# Patient Record
Sex: Female | Born: 1970 | Race: White | Hispanic: No | Marital: Single | Smoking: Former smoker
Health system: Southern US, Community
[De-identification: ages and names within clinical notes are randomized; demographics above are authoritative.]

## PROBLEM LIST (undated history)

## (undated) DIAGNOSIS — J45909 Unspecified asthma, uncomplicated: Secondary | ICD-10-CM

## (undated) DIAGNOSIS — J309 Allergic rhinitis, unspecified: Secondary | ICD-10-CM

## (undated) DIAGNOSIS — F1910 Other psychoactive substance abuse, uncomplicated: Secondary | ICD-10-CM

## (undated) DIAGNOSIS — T7840XA Allergy, unspecified, initial encounter: Secondary | ICD-10-CM

## (undated) HISTORY — DX: Unspecified asthma, uncomplicated: J45.909

## (undated) HISTORY — DX: Other psychoactive substance abuse, uncomplicated: F19.10

## (undated) HISTORY — PX: AUGMENTATION MAMMAPLASTY: SUR837

## (undated) HISTORY — DX: Allergic rhinitis, unspecified: J30.9

## (undated) HISTORY — DX: Allergy, unspecified, initial encounter: T78.40XA

---

## 1997-12-08 ENCOUNTER — Ambulatory Visit (HOSPITAL_COMMUNITY): Admission: RE | Admit: 1997-12-08 | Discharge: 1997-12-08 | Payer: Self-pay | Admitting: Gastroenterology

## 1998-02-09 ENCOUNTER — Ambulatory Visit (HOSPITAL_COMMUNITY): Admission: RE | Admit: 1998-02-09 | Discharge: 1998-02-09 | Payer: Self-pay | Admitting: Gastroenterology

## 1998-04-02 ENCOUNTER — Other Ambulatory Visit: Admission: RE | Admit: 1998-04-02 | Discharge: 1998-04-02 | Payer: Self-pay | Admitting: Gynecology

## 1998-05-02 ENCOUNTER — Encounter: Admission: RE | Admit: 1998-05-02 | Discharge: 1998-07-31 | Payer: Self-pay | Admitting: Gynecology

## 1998-10-29 ENCOUNTER — Inpatient Hospital Stay (HOSPITAL_COMMUNITY): Admission: AD | Admit: 1998-10-29 | Discharge: 1998-11-01 | Payer: Self-pay | Admitting: Gynecology

## 1998-12-13 ENCOUNTER — Other Ambulatory Visit: Admission: RE | Admit: 1998-12-13 | Discharge: 1998-12-13 | Payer: Self-pay | Admitting: Gynecology

## 2000-10-06 ENCOUNTER — Other Ambulatory Visit: Admission: RE | Admit: 2000-10-06 | Discharge: 2000-10-06 | Payer: Self-pay | Admitting: Gynecology

## 2001-05-06 ENCOUNTER — Inpatient Hospital Stay (HOSPITAL_COMMUNITY): Admission: AD | Admit: 2001-05-06 | Discharge: 2001-05-08 | Payer: Self-pay | Admitting: Gynecology

## 2001-06-18 ENCOUNTER — Other Ambulatory Visit: Admission: RE | Admit: 2001-06-18 | Discharge: 2001-06-18 | Payer: Self-pay | Admitting: Gynecology

## 2003-02-24 ENCOUNTER — Other Ambulatory Visit: Admission: RE | Admit: 2003-02-24 | Discharge: 2003-02-24 | Payer: Self-pay | Admitting: Gynecology

## 2004-03-15 ENCOUNTER — Other Ambulatory Visit: Admission: RE | Admit: 2004-03-15 | Discharge: 2004-03-15 | Payer: Self-pay | Admitting: Gynecology

## 2004-04-12 ENCOUNTER — Ambulatory Visit: Payer: Self-pay | Admitting: Internal Medicine

## 2004-05-10 ENCOUNTER — Ambulatory Visit: Payer: Self-pay | Admitting: Internal Medicine

## 2004-07-08 ENCOUNTER — Ambulatory Visit: Payer: Self-pay | Admitting: Internal Medicine

## 2005-03-17 ENCOUNTER — Ambulatory Visit: Payer: Self-pay | Admitting: Internal Medicine

## 2005-03-21 ENCOUNTER — Other Ambulatory Visit: Admission: RE | Admit: 2005-03-21 | Discharge: 2005-03-21 | Payer: Self-pay | Admitting: Gynecology

## 2005-05-30 ENCOUNTER — Ambulatory Visit: Payer: Self-pay | Admitting: Internal Medicine

## 2005-09-12 ENCOUNTER — Ambulatory Visit (HOSPITAL_COMMUNITY): Admission: RE | Admit: 2005-09-12 | Discharge: 2005-09-12 | Payer: Self-pay | Admitting: Gynecology

## 2006-04-07 ENCOUNTER — Other Ambulatory Visit: Admission: RE | Admit: 2006-04-07 | Discharge: 2006-04-07 | Payer: Self-pay | Admitting: Gynecology

## 2006-06-09 HISTORY — PX: UMBILICAL HERNIA REPAIR: SHX196

## 2006-11-20 ENCOUNTER — Ambulatory Visit: Payer: Self-pay | Admitting: Internal Medicine

## 2007-03-04 DIAGNOSIS — J45909 Unspecified asthma, uncomplicated: Secondary | ICD-10-CM | POA: Insufficient documentation

## 2007-03-04 DIAGNOSIS — F509 Eating disorder, unspecified: Secondary | ICD-10-CM | POA: Insufficient documentation

## 2007-03-04 DIAGNOSIS — J309 Allergic rhinitis, unspecified: Secondary | ICD-10-CM | POA: Insufficient documentation

## 2007-03-25 ENCOUNTER — Encounter: Payer: Self-pay | Admitting: Internal Medicine

## 2007-04-23 ENCOUNTER — Encounter: Payer: Self-pay | Admitting: Internal Medicine

## 2007-11-17 ENCOUNTER — Encounter: Payer: Self-pay | Admitting: Internal Medicine

## 2008-01-13 ENCOUNTER — Other Ambulatory Visit: Admission: RE | Admit: 2008-01-13 | Discharge: 2008-01-13 | Payer: Self-pay | Admitting: Family Medicine

## 2008-09-07 HISTORY — PX: VAGINAL WOUND CLOSURE / REPAIR: SUR258

## 2008-09-18 ENCOUNTER — Ambulatory Visit: Payer: Self-pay | Admitting: Gynecology

## 2008-09-18 ENCOUNTER — Ambulatory Visit (HOSPITAL_BASED_OUTPATIENT_CLINIC_OR_DEPARTMENT_OTHER): Admission: RE | Admit: 2008-09-18 | Discharge: 2008-09-18 | Payer: Self-pay | Admitting: Gynecology

## 2008-09-27 ENCOUNTER — Encounter: Payer: Self-pay | Admitting: Internal Medicine

## 2008-10-02 ENCOUNTER — Ambulatory Visit: Payer: Self-pay | Admitting: Gynecology

## 2008-11-13 ENCOUNTER — Encounter: Payer: Self-pay | Admitting: Internal Medicine

## 2009-05-15 ENCOUNTER — Other Ambulatory Visit: Admission: RE | Admit: 2009-05-15 | Discharge: 2009-05-15 | Payer: Self-pay | Admitting: Gynecology

## 2009-05-15 ENCOUNTER — Ambulatory Visit: Payer: Self-pay | Admitting: Gynecology

## 2010-07-09 ENCOUNTER — Ambulatory Visit (HOSPITAL_COMMUNITY)
Admission: RE | Admit: 2010-07-09 | Discharge: 2010-07-09 | Payer: Self-pay | Source: Home / Self Care | Attending: Gynecology | Admitting: Gynecology

## 2010-07-10 ENCOUNTER — Encounter: Payer: Self-pay | Admitting: Gynecology

## 2010-07-10 ENCOUNTER — Ambulatory Visit: Admit: 2010-07-10 | Payer: Self-pay | Admitting: Gynecology

## 2010-07-17 ENCOUNTER — Other Ambulatory Visit: Payer: Self-pay | Admitting: Gynecology

## 2010-07-17 ENCOUNTER — Encounter: Payer: 59 | Admitting: Gynecology

## 2010-07-17 ENCOUNTER — Other Ambulatory Visit (HOSPITAL_COMMUNITY)
Admission: RE | Admit: 2010-07-17 | Discharge: 2010-07-17 | Disposition: A | Discharge status: Home / Self Care | Payer: 59 | Source: Ambulatory Visit | Attending: Gynecology | Admitting: Gynecology

## 2010-07-17 DIAGNOSIS — Z124 Encounter for screening for malignant neoplasm of cervix: Secondary | ICD-10-CM | POA: Insufficient documentation

## 2010-07-17 DIAGNOSIS — Z13220 Encounter for screening for lipoid disorders: Secondary | ICD-10-CM

## 2010-07-17 DIAGNOSIS — Z01419 Encounter for gynecological examination (general) (routine) without abnormal findings: Secondary | ICD-10-CM

## 2010-07-17 DIAGNOSIS — Z30431 Encounter for routine checking of intrauterine contraceptive device: Secondary | ICD-10-CM

## 2010-09-18 LAB — POCT PREGNANCY, URINE: Preg Test, Ur: NEGATIVE

## 2010-10-22 NOTE — H&P (Signed)
NAMEVERNELL, Shelly Edwards            ACCOUNT NO.:  [id number]   MEDICAL RECORD NO.:  [id number]          PATIENT TYPE:  AMB   LOCATION:  [facility]                         FACILITY:  [facility]   PHYSICIAN:  Juan H. Lily Peer, M.D.DATE OF BIRTH:  February 14, 1971   DATE OF ADMISSION:  DATE OF DISCHARGE:                              HISTORY & PHYSICAL   The patient is scheduled for surgery today at 1 p.m. at [facility].   CHIEF COMPLAINT:  Vaginal bleeding.   HISTORY:  The patient is a 40 year old, gravida 2, para 2, who presented  to the office this morning complaining of vaginal bleeding after  intercourse Saturday night.  On inspection, she was found to have a  right vaginal wall laceration that was bleeding.  A pressure gauze was  placed for tamponade effect.  She did have also an ultrasound due the  fact that she had a Mirena IUD placed Shelly Edwards in 2007 and the IUD string  was not seen.  The ultrasound confirmed that the IUD was in its right  place and normal-appearing ovaries.  The patient otherwise had been  doing well.   PAST MEDICAL HISTORY:  The patient denies any allergies.   MEDICATIONS:  1. Singulair p.r.n.  2. Loratadine,  3. Multivitamin.  4. Albuterol p.r.n.  5. Mirena IUD placed in 2007.  6. Allergy shots.   MEDICAL CONDITIONS:  History of asthma and allergies.  No prior  surgeries.   FAMILY HISTORY:  Father and mother with hypertension, and paternal  grandmother with colon cancer.  Father with heart disease and breast  cancer in maternal grandmother.   PHYSICAL EXAMINATION:  The patient is 5 feet 2 inches tall, weight 126  pounds, blood pressure 120/18.  HEENT:  Unremarkable.  NECK:  Supple.  Trachea midline.  No carotid bruits, no thyromegaly.  LUNGS: Clear to auscultation without rhonchi or wheezes.  HEART:  Regular rate and rhythm.  No murmurs or gallops.  BREAST EXAM:  Not done.  ABDOMEN:  Soft, nontender rebound or guarding.  PELVIC:  Bartholin,  urethral, and Skene glands within normal limits.  Vagina with a right vaginal wall laceration.  Cervix - no gross lesions  on inspection.  Bimanual exam was not done.  An ultrasound demonstrated  a normal-appearing uterus and normal-appearing ovaries, and IUD in  place.  RECTAL EXAM:  Deferred.   ASSESSMENT:  A 40 year old, gravida 2, para 2, with postcoital bleeding  after which demonstrated that the source was from a right vaginal wall  laceration.  She is going to be taken to the Operating Room today for  repair of right vaginal wall laceration.  We will be checking a CBC and  a UPT in our office.  She will receive cefotetan 1 g prophylactically  before the procedure and I have given her a prescription of Tylox to  take 1 p.o. q.4-6 h. p.r.n. and Reglan 10 mg 1 p.o. q.4-6 h. p.r.n.  nausea, for her to take postop if needed, and she will see me Shelly Edwards in  the office 2 weeks after the procedure for a postop appointment.  PLAN:  As per assessment above.      Juan H. Lily Peer, M.D.  Electronically Signed     JHF/MEDQ  D:  09/18/2008  T:  09/18/2008  Job:  [id number]

## 2010-10-22 NOTE — Op Note (Signed)
Shelly Edwards, Shelly Edwards            ACCOUNT NO.:  [id number]   MEDICAL RECORD NO.:  [id number]          PATIENT TYPE:  AMB   LOCATION:  [facility]                         FACILITY:  [facility]   PHYSICIAN:  Juan H. Lily Peer, M.D.DATE OF BIRTH:  18-Apr-1971   DATE OF PROCEDURE:  09/18/2008  DATE OF DISCHARGE:                               OPERATIVE REPORT   INDICATIONS FOR OPERATION:  A 40 year old gravida 2, para 2 with right  vaginal wall laceration after intercourse this weekend.   PREOPERATIVE DIAGNOSIS:  Right vaginal wall laceration.   POSTOPERATIVE DIAGNOSES:  Right vaginal wall laceration.   ANESTHESIA:  General endotracheal anesthesia.   PROCEDURE PERFORMED:  Repair of right vaginal wall laceration.   FINDINGS:  Right vaginal wall laceration measuring approximately 3 cm in  length and 2 cm in width.   DESCRIPTION OF THE OPERATION:  After the patient was adequately  counseled, she was taken to the operating room where she underwent  successful general endotracheal anesthesia.  She received a gram of  Cefoxitin IV prophylactically.  She was placed on high lithotomy  position.  The vagina was prepped and draped in the usual sterile  fashion.  A Foley catheter was inserted to evacuate the bladder of its  contents.  With 2 Deaver retractors for exposure, the right vaginal  laceration was identified.  The area was copiously irrigated with normal  saline solution as well as Betadine solution.  The laceration was  repaired with locking stitch of 0 Vicryl suture.  The area was copiously  irrigated with normal saline solution as well again postop and then  estrogen cream was applied.  The patient extubated and transferred to  recovery room with stable vital signs.  Blood loss from procedure was  minimal.  Fluid resuscitation consisted of 500 cc of lactated Ringer's.      Juan H. Lily Peer, M.D.  Electronically Signed     JHF/MEDQ  D:  09/18/2008  T:  09/19/2008  Job:  [id number]

## 2010-10-25 NOTE — H&P (Signed)
[facility]  Patient:    Shelly Edwards, Shelly Edwards Visit Number: [id number] MRN: [id number]          Service Type: Attending:  Gaetano Hawthorne. Lily Peer, M.D. Dictated by:   Gaetano Hawthorne Lily Peer, M.D. Adm. Date:  05/06/01                           History and Physical  CHIEF COMPLAINT:              Spontaneous rupture of membranes.  HISTORY OF PRESENT ILLNESS:   The patient is a 40 year old gravida 2, para 1. Last menstrual period August 01, 2000.  Corrected estimate date of confinement May 09, 2001.  Currently 39-1/2 weeks estimated gestational age.  Had complaint of rupture of membranes approximately between 12:30 and 1:30 a.m. on May 06, 2001.  Upon arrival to [facility], she was checked and was found to be approximately 2-3 cm dilated, gross rupture of membranes with positive nitrazine and positive ferning, and was having contractions every five minutes apart.  The patients fetal heart rate tracing had a low baseline of 120 to 120 with some mild variables that later corrected after IV fluids and had reassuring fetal heart tracing.  PRENATAL HISTORY:             The patient with one normal spontaneous vaginal delivery in the year May 2000 of a 7 pounds and 13 ounce female.  The patients husband with a history of positive CMV infection several years ago.  The patient was tested during this pregnancy and was IgG positive, but IgM negative on both acute and convalescent titers.  The patient with a history of asthma, seasonal, and was taking Pulmicort and Claritin p.r.n.  PAST MEDICAL HISTORY:         As per above.  ALLERGIES:                    None.  MEDICATIONS:                  Pulmicort and Claritin and history of asthma, and one prior normal spontaneous vaginal delivery.  REVIEW OF SYSTEMS:            See __________ forms.  PHYSICAL EXAMINATION:  VITAL SIGNS:                  Stable and afebrile.  HEENT:                         Unremarkable.  NECK:                         Supple, trachea midline.  No carotid bruits.  No thyromegaly.  LUNGS:                        Clear to auscultation without rhonchi or wheezes.  HEART:                        Regular rate and rhythm without any murmurs or gallops.  BREASTS:                      Exam not done.  ABDOMEN:                      Gravid uterus, vertex presentation, positive  Leopold maneuver, positive fetal heart tone.  CERVIX:                       Three centimeters dilated, 80% effaced, -3 station.  Clear amniotic fluid.  Gross rupture of membranes.  EXTREMITIES:                  DTRs 1+.  Negative clonus.  Trace edema.  PRENATAL LABORATORY DATA:     Patient with O-positive blood type, negative antibody screen.  VDRL was nonreactive.  Rubella immune.  Hepatitis B surface antigen and HIV were negative.  Pap smear was normal.  Fetal protein was normal.  Diabetes screen was normal.  GBS culture was negative.  ASSESSMENT:                   A 40 year old gravida 2, para 1, at 39-1/2 weeks                               estimated gestational age with spontaneous                               rupture of membranes at approximately 12:30 a.m.                               on May 06, 2001.  Contractions every five                               to six minutes apart.  Cervix 3 cm dilated, 80%                               effaced, -3 station with gross rupture of                               membranes.  Reassuring fetal heart rate tracing.  PLAN:                         Patient to be admitted to labor and delivery once the epidural is on board, and will begin to augment with Pitocin at a high dose per protocol and the GBS culture was negative, and anticipate normal spontaneous vaginal delivery.  Plan is per assessment above. Dictated by:   Gaetano Hawthorne Lily Peer, M.D. Attending:  Gaetano Hawthorne. Lily Peer, M.D. DD:  05/06/01 TD:  05/06/01 Job: [id number]

## 2010-10-25 NOTE — Discharge Summary (Signed)
[facility]  Patient:    Shelly Edwards, Shelly Edwards Visit Number: [id number] MRN: [id number]          Service Type: OBS Location: 910A [id number] [id number] Attending Physician:  Tonye Royalty Dictated by:   Antony Contras, N.P. Admit Date:  05/06/2001 Discharge Date: 05/08/2001                             Discharge Summary  DISCHARGE DIAGNOSES:          Intrauterine pregnancy at term, spontaneous rupture of membranes.  PROCEDURE:                    Normal spontaneous vaginal delivery of viable infant over a intact perineum with repair of second degree laceration.  HISTORY OF PRESENT ILLNESS:   Patient is a 40 year old gravida 2, para 1-0-0-1 with LMP ______ [facility] May 09, 2001.  Prenatal course was complicated by history of asthma.  PRENATAL LABORATORIES:        Blood type O+.  Antibody screen negative.  RPR, HBSAG, HIV nonreactive.  MSAFP normal.  GBS negative.  HOSPITAL COURSE:              Patient was admitted on May 06, 2001 with spontaneous rupture of membranes.  Cervix was 2-3 cm dilated.  She was admitted to begin augmentation with Pitocin high dose protocol.  She progressed to completely dilatation.  Delivered an Apgar 8/8 female infant, birth weight 6 pounds 14 ounces over an intact perineum with repair of second degree laceration.  Postpartum course she remained afebrile, had no difficulty voiding.  Was able to be discharged in satisfactory condition on her second postpartum day.  LABORATORIES:                 CBC:  Hematocrit 33.1, hemoglobin 11.6, platelets 178,000.  DISPOSITION:                  Follow up in six weeks.  Continue prenatal vitamins and iron.  Motrin and Tylox for pain. Dictated by:   Antony Contras, N.P. Attending Physician:  Tonye Royalty DD:  06/11/01 TD:  06/12/01 Job: [id number]

## 2012-05-12 ENCOUNTER — Other Ambulatory Visit: Payer: Self-pay | Admitting: Gynecology

## 2012-05-12 DIAGNOSIS — Z1231 Encounter for screening mammogram for malignant neoplasm of breast: Secondary | ICD-10-CM

## 2012-05-27 ENCOUNTER — Encounter: Payer: Self-pay | Admitting: Gynecology

## 2012-05-27 ENCOUNTER — Ambulatory Visit (INDEPENDENT_AMBULATORY_CARE_PROVIDER_SITE_OTHER): Payer: 59 | Admitting: Gynecology

## 2012-05-27 VITALS — BP 120/68 | Ht 61.0 in | Wt 124.0 lb

## 2012-05-27 DIAGNOSIS — Z01419 Encounter for gynecological examination (general) (routine) without abnormal findings: Secondary | ICD-10-CM

## 2012-05-27 NOTE — Progress Notes (Signed)
Lakea Edwards 10/07/1970 [id number]   History:    41 y.o.  for annual gyn exam with no complaints. She is having normal menstrual cycles. Her mammogram was in January 2012 was normal. Patient does her monthly self breast examination. No prior history of abnormal Pap smear. Patient's flu vaccine in Tdap are up-to-date. Patient with saline breast implants. Husband has had a vasectomy.  Past medical history,surgical history, family history and social history were all reviewed and documented in the EPIC chart.  Gynecologic History Patient's last menstrual period was 05/04/2012. Contraception: vasectomy Last Pap: 2012. Results were: normal Last mammogram: 2012 Results were: normal  Obstetric History OB History    Grav Para Term Preterm Abortions TAB SAB Ect Mult Living   2 2 2       2      # Outc Date GA Lbr Len/2nd Wgt Sex Del Anes PTL Lv   1 TRM     M SVD  No Yes   2 TRM     M SVD  No Yes       ROS: A ROS was performed and pertinent positives and negatives are included in the history.  GENERAL: No fevers or chills. HEENT: No change in vision, no earache, sore throat or sinus congestion. NECK: No pain or stiffness. CARDIOVASCULAR: No chest pain or pressure. No palpitations. PULMONARY: No shortness of breath, cough or wheeze. GASTROINTESTINAL: No abdominal pain, nausea, vomiting or diarrhea, melena or bright red blood per rectum. GENITOURINARY: No urinary frequency, urgency, hesitancy or dysuria. MUSCULOSKELETAL: No joint or muscle pain, no back pain, no recent trauma. DERMATOLOGIC: No rash, no itching, no lesions. ENDOCRINE: No polyuria, polydipsia, no heat or cold intolerance. No recent change in weight. HEMATOLOGICAL: No anemia or easy bruising or bleeding. NEUROLOGIC: No headache, seizures, numbness, tingling or weakness. PSYCHIATRIC: No depression, no loss of interest in normal activity or change in sleep pattern.     Exam: chaperone present  BP 120/68  Ht 5\' 1"  (1.549 m)   Wt 124 lb (56.246 kg)  BMI 23.43 kg/m2  LMP 05/04/2012  Body mass index is 23.43 kg/(m^2).  General appearance : Well developed well nourished female. No acute distress HEENT: Neck supple, trachea midline, no carotid bruits, no thyroidmegaly Lungs: Clear to auscultation, no rhonchi or wheezes, or rib retractions  Heart: Regular rate and rhythm, no murmurs or gallops Breast:Examined in sitting and supine position were symmetrical in appearance, no palpable masses or tenderness,  no skin retraction, no nipple inversion, no nipple discharge, no skin discoloration, no axillary or supraclavicular lymphadenopathy Abdomen: no palpable masses or tenderness, no rebound or guarding Extremities: no edema or skin discoloration or tenderness  Pelvic:  Bartholin, Urethra, Skene Glands: Within normal limits             Vagina: No gross lesions or discharge  Cervix: No gross lesions or discharge  Uterus  anteverted, normal size, shape and consistency, non-tender and mobile  Adnexa  Without masses or tenderness  Anus and perineum  normal   Rectovaginal  normal sphincter tone without palpated masses or tenderness             Hemoccult not done     Assessment/Plan:  41 y.o. female for annual exam who was instructed to begin taking calcium and vitamin D for osteoporosis prevention. Her primary physician recently did her lab work and she is going to obtain a copy so that we can scan and put in the epic records. No Pap smear  done today to guidelines discussed.    Ok Edwards MD, 3:03 PM 05/27/2012

## 2012-05-27 NOTE — Patient Instructions (Addendum)
Recommended daily requirement of calcium is 1200-1500 mg daily and 1,000-2,000 units of vitamin D3 cholecalciferol daily.

## 2012-06-04 ENCOUNTER — Encounter: Payer: Self-pay | Admitting: Gynecology

## 2012-06-10 ENCOUNTER — Ambulatory Visit (HOSPITAL_COMMUNITY)
Admission: RE | Admit: 2012-06-10 | Discharge: 2012-06-10 | Disposition: A | Discharge status: Home / Self Care | Payer: 59 | Source: Ambulatory Visit | Attending: Gynecology | Admitting: Gynecology

## 2012-06-10 ENCOUNTER — Other Ambulatory Visit: Payer: Self-pay | Admitting: Gynecology

## 2012-06-10 DIAGNOSIS — Z1231 Encounter for screening mammogram for malignant neoplasm of breast: Secondary | ICD-10-CM | POA: Insufficient documentation

## 2012-06-17 ENCOUNTER — Other Ambulatory Visit: Payer: Self-pay | Admitting: Gynecology

## 2012-06-17 ENCOUNTER — Other Ambulatory Visit: Payer: Self-pay | Admitting: *Deleted

## 2012-06-17 DIAGNOSIS — N6310 Unspecified lump in the right breast, unspecified quadrant: Secondary | ICD-10-CM

## 2012-06-17 DIAGNOSIS — R928 Other abnormal and inconclusive findings on diagnostic imaging of breast: Secondary | ICD-10-CM

## 2012-07-07 ENCOUNTER — Ambulatory Visit
Admission: RE | Admit: 2012-07-07 | Discharge: 2012-07-07 | Disposition: A | Discharge status: Home / Self Care | Payer: 59 | Source: Ambulatory Visit | Attending: Gynecology | Admitting: Gynecology

## 2012-07-07 DIAGNOSIS — N6310 Unspecified lump in the right breast, unspecified quadrant: Secondary | ICD-10-CM

## 2012-07-07 DIAGNOSIS — R928 Other abnormal and inconclusive findings on diagnostic imaging of breast: Secondary | ICD-10-CM

## 2013-05-30 ENCOUNTER — Ambulatory Visit (INDEPENDENT_AMBULATORY_CARE_PROVIDER_SITE_OTHER): Payer: 59 | Admitting: Gynecology

## 2013-05-30 ENCOUNTER — Encounter: Payer: Self-pay | Admitting: Gynecology

## 2013-05-30 VITALS — BP 118/72 | Ht 63.5 in | Wt 124.2 lb

## 2013-05-30 DIAGNOSIS — Z01419 Encounter for gynecological examination (general) (routine) without abnormal findings: Secondary | ICD-10-CM

## 2013-05-30 NOTE — Progress Notes (Signed)
Shelly Edwards 12/29/1970 [id number]   History:    42 y.o.  for annual gyn exam with no complaints today. Patient's husband has had a vasectomy. She is having normal menstrual cycles. Her mammogram was normal this year but her breasts reduced. She does have implants.  Patient has informed me of the following family history: Both mother and father had history of benign colon polyps Grandmother on father's side had colon cancer history Grandmother on mother's side had history of breast cancer  Patient with no prior history of abnormal Pap smears  Past medical history,surgical history, family history and social history were all reviewed and documented in the EPIC chart.  Gynecologic History Patient's last menstrual period was 05/25/2013. Contraception: vasectomy Last Pap: 2012. Results were: normal Last mammogram: 2014. Results were: normal but dense  Obstetric History OB History  Gravida Para Term Preterm AB SAB TAB Ectopic Multiple Living  2 2 2       2     # Outcome Date GA Lbr Len/2nd Weight Sex Delivery Anes PTL Lv  2 TRM     M SVD  N Y  1 TRM     M SVD  N Y       ROS: A ROS was performed and pertinent positives and negatives are included in the history.  GENERAL: No fevers or chills. HEENT: No change in vision, no earache, sore throat or sinus congestion. NECK: No pain or stiffness. CARDIOVASCULAR: No chest pain or pressure. No palpitations. PULMONARY: No shortness of breath, cough or wheeze. GASTROINTESTINAL: No abdominal pain, nausea, vomiting or diarrhea, melena or bright red blood per rectum. GENITOURINARY: No urinary frequency, urgency, hesitancy or dysuria. MUSCULOSKELETAL: No joint or muscle pain, no back pain, no recent trauma. DERMATOLOGIC: No rash, no itching, no lesions. ENDOCRINE: No polyuria, polydipsia, no heat or cold intolerance. No recent change in weight. HEMATOLOGICAL: No anemia or easy bruising or bleeding. NEUROLOGIC: No headache, seizures,  numbness, tingling or weakness. PSYCHIATRIC: No depression, no loss of interest in normal activity or change in sleep pattern.     Exam: chaperone present  BP 118/72  Ht 5' 3.5" (1.613 m)  Wt 124 lb 3.2 oz (56.337 kg)  BMI 21.65 kg/m2  LMP 05/25/2013  Body mass index is 21.65 kg/(m^2).  General appearance : Well developed well nourished female. No acute distress HEENT: Neck supple, trachea midline, no carotid bruits, no thyroidmegaly Lungs: Clear to auscultation, no rhonchi or wheezes, or rib retractions  Heart: Regular rate and rhythm, no murmurs or gallops Breast:Examined in sitting and supine position were symmetrical in appearance, no palpable masses or tenderness,  no skin retraction, no nipple inversion, no nipple discharge, no skin discoloration, no axillary or supraclavicular lymphadenopathy Abdomen: no palpable masses or tenderness, no rebound or guarding Extremities: no edema or skin discoloration or tenderness  Pelvic:  Bartholin, Urethra, Skene Glands: Within normal limits             Vagina: No gross lesions or discharge  Cervix: No gross lesions or discharge  Uterus  anteverted, normal size, shape and consistency, non-tender and mobile  Adnexa  Without masses or tenderness  Anus and perineum  normal   Rectovaginal  normal sphincter tone without palpated masses or tenderness             Hemoccult none indicated     Assessment/Plan:  42 y.o. female for annual exam who had her blood drawn today by her PCP. Patient states all her vaccines  are up-to-date. Pap smear not done today the new guidelines were discussed. We discussed the importance of calcium vitamin D and regular exercise for osteoporosis prevention. It will be recommended that patient next year have a screening colonoscopy because of family history of colon polyps and colon cancer. Literature information on Lynch Syndrome testing was provided.  Note: This dictation was prepared with  Dragon/digital dictation  along withSmart phrase technology. Any transcriptional errors that result from this process are unintentional.   Ok Edwards MD, 2:41 PM 05/30/2013

## 2013-05-30 NOTE — Patient Instructions (Signed)
Colonoscopy A colonoscopy is an exam to evaluate your entire colon. In this exam, your colon is cleansed. A long fiberoptic tube is inserted through your rectum and into your colon. The fiberoptic scope (endoscope) is a long bundle of enclosed and very flexible fibers. These fibers transmit light to the area examined and send images from that area to your caregiver. Discomfort is usually minimal. You may be given a drug to help you sleep (sedative) during or prior to the procedure. This exam helps to detect lumps (tumors), polyps, inflammation, and areas of bleeding. Your caregiver may also take a small piece of tissue (biopsy) that will be examined under a microscope. LET YOUR CAREGIVER KNOW ABOUT:   Allergies to food or medicine.  Medicines taken, including vitamins, herbs, eyedrops, over-the-counter medicines, and creams.  Use of steroids (by mouth or creams).  Previous problems with anesthetics or numbing medicines.  History of bleeding problems or blood clots.  Previous surgery.  Other health problems, including diabetes and kidney problems.  Possibility of pregnancy, if this applies. BEFORE THE PROCEDURE   A clear liquid diet may be required for 2 days before the exam.  Ask your caregiver about changing or stopping your regular medications.  Liquid injections (enemas) or laxatives may be required.  A large amount of electrolyte solution may be given to you to drink over a short period of time. This solution is used to clean out your colon.  You should be present 60 minutes prior to your procedure or as directed by your caregiver. AFTER THE PROCEDURE   If you received a sedative or pain relieving medication, you will need to arrange for someone to drive you home.  Occasionally, there is a little blood passed with the first bowel movement. Do not be concerned. FINDING OUT THE RESULTS OF YOUR TEST Not all test results are available during your visit. If your test results are  not back during the visit, make an appointment with your caregiver to find out the results. Do not assume everything is normal if you have not heard from your caregiver or the medical facility. It is important for you to follow up on all of your test results. HOME CARE INSTRUCTIONS   It is not unusual to pass moderate amounts of gas and experience mild abdominal cramping following the procedure. This is due to air being used to inflate your colon during the exam. Walking or a warm pack on your belly (abdomen) may help.  You may resume all normal meals and activities after sedatives and medicines have worn off.  Only take over-the-counter or prescription medicines for pain, discomfort, or fever as directed by your caregiver. Do not use aspirin or blood thinners if a biopsy was taken. Consult your caregiver for medicine usage if biopsies were taken. SEEK IMMEDIATE MEDICAL CARE IF:   You have a fever.  You pass large blood clots or fill a toilet with blood following the procedure. This may also occur 10 to 14 days following the procedure. This is more likely if a biopsy was taken.  You develop abdominal pain that keeps getting worse and cannot be relieved with medicine. Document Released: 05/23/2000 Document Revised: 08/18/2011 Document Reviewed: 12/27/2012 [employer] Patient Information 2014 [employer].  

## 2014-04-10 ENCOUNTER — Encounter: Payer: Self-pay | Admitting: Gynecology

## 2014-05-19 ENCOUNTER — Other Ambulatory Visit: Payer: Self-pay

## 2014-05-19 DIAGNOSIS — Z1231 Encounter for screening mammogram for malignant neoplasm of breast: Secondary | ICD-10-CM

## 2014-05-31 ENCOUNTER — Encounter: Payer: Self-pay | Admitting: Gynecology

## 2014-05-31 ENCOUNTER — Other Ambulatory Visit (HOSPITAL_COMMUNITY)
Admission: RE | Admit: 2014-05-31 | Discharge: 2014-05-31 | Disposition: A | Discharge status: Home / Self Care | Payer: 59 | Source: Ambulatory Visit | Attending: Gynecology | Admitting: Gynecology

## 2014-05-31 ENCOUNTER — Ambulatory Visit (INDEPENDENT_AMBULATORY_CARE_PROVIDER_SITE_OTHER): Payer: 59 | Admitting: Gynecology

## 2014-05-31 VITALS — BP 112/78 | Ht 61.0 in | Wt 127.0 lb

## 2014-05-31 DIAGNOSIS — Z01411 Encounter for gynecological examination (general) (routine) with abnormal findings: Secondary | ICD-10-CM | POA: Diagnosis present

## 2014-05-31 DIAGNOSIS — Z1151 Encounter for screening for human papillomavirus (HPV): Secondary | ICD-10-CM | POA: Insufficient documentation

## 2014-05-31 DIAGNOSIS — Z01419 Encounter for gynecological examination (general) (routine) without abnormal findings: Secondary | ICD-10-CM

## 2014-05-31 DIAGNOSIS — Z8371 Family history of colonic polyps: Secondary | ICD-10-CM

## 2014-05-31 NOTE — Progress Notes (Signed)
Shelly Edwards 1971/06/03 [id number]   History:    43 y.o.  for annual exam with no complaints today.Patient's husband has had a vasectomy. She is having normal menstrual cycles. Her mammogram was normal this year but her breasts reduced. She does have implants.  Patient has informed me of the following family history: Both mother and father had history of benign colon polyps Grandmother on father's side had colon cancer history Grandmother on mother's side had history of breast cancer  Patient with no prior history of abnormal Pap smears  Last year was recommended that she undergo a screening colonoscopy because of family history of colon polyps and colon cancer. Also literature information on Lynch Syndrome testing was provided.   Past medical history,surgical history, family history and social history were all reviewed and documented in the EPIC chart.  Gynecologic History Patient's last menstrual period was 05/23/2014. Contraception: vasectomy Last Pap: 2012. Results were: normal Last mammogram: 2014. Results were: Normal but patient with saline implants and dense breasts  Obstetric History OB History  Gravida Para Term Preterm AB SAB TAB Ectopic Multiple Living  2 2 2       2     # Outcome Date GA Lbr Len/2nd Weight Sex Delivery Anes PTL Lv  2 Term     M Vag-Spont  N Y  1 Term     M Vag-Spont  N Y       ROS: A ROS was performed and pertinent positives and negatives are included in the history.  GENERAL: No fevers or chills. HEENT: No change in vision, no earache, sore throat or sinus congestion. NECK: No pain or stiffness. CARDIOVASCULAR: No chest pain or pressure. No palpitations. PULMONARY: No shortness of breath, cough or wheeze. GASTROINTESTINAL: No abdominal pain, nausea, vomiting or diarrhea, melena or bright red blood per rectum. GENITOURINARY: No urinary frequency, urgency, hesitancy or dysuria. MUSCULOSKELETAL: No joint or muscle pain, no back pain, no  recent trauma. DERMATOLOGIC: No rash, no itching, no lesions. ENDOCRINE: No polyuria, polydipsia, no heat or cold intolerance. No recent change in weight. HEMATOLOGICAL: No anemia or easy bruising or bleeding. NEUROLOGIC: No headache, seizures, numbness, tingling or weakness. PSYCHIATRIC: No depression, no loss of interest in normal activity or change in sleep pattern.     Exam: chaperone present  BP 112/78 mmHg  Ht 5\' 1"  (1.549 m)  Wt 127 lb (57.607 kg)  BMI 24.01 kg/m2  LMP 05/23/2014  Body mass index is 24.01 kg/(m^2).  General appearance : Well developed well nourished female. No acute distress HEENT: Neck supple, trachea midline, no carotid bruits, no thyroidmegaly Lungs: Clear to auscultation, no rhonchi or wheezes, or rib retractions  Heart: Regular rate and rhythm, no murmurs or gallops Breast:Examined in sitting and supine position were symmetrical in appearance, no palpable masses or tenderness,  no skin retraction, no nipple inversion, no nipple discharge, no skin discoloration, no axillary or supraclavicular lymphadenopathy Abdomen: no palpable masses or tenderness, no rebound or guarding Extremities: no edema or skin discoloration or tenderness  Pelvic:  Bartholin, Urethra, Skene Glands: Within normal limits             Vagina: No gross lesions or discharge  Cervix: No gross lesions or discharge  Uterus  anteverted, normal size, shape and consistency, non-tender and mobile  Adnexa  Without masses or tenderness  Anus and perineum  normal   Rectovaginal  normal sphincter tone without palpated masses or tenderness  Hemoccult not indicated     Assessment/Plan:  43 y.o. female for annual exam will have her blood drawn by her PCP. Patient's vaccines are all up-to-date. We discussed importance of calcium vitamin D and regular exercise for osteoporosis prevention. Pap smear was done today. I've given her the name of the gastric neurologist in her community for her to  schedule a screening colonoscopy because of family history of colon polyps and colon cancer. I also provided her with literature information on Lynch syndrome for for consideration of testing which she will discuss with the gastroenterologists.   Terrance Mass MD, 2:39 PM 05/31/2014

## 2014-05-31 NOTE — Patient Instructions (Signed)
Colonoscopy  A colonoscopy is an exam to look at the entire large intestine (colon). This exam can help find problems such as tumors, polyps, inflammation, and areas of bleeding. The exam takes about 1 hour.   LET YOUR HEALTH CARE PROVIDER KNOW ABOUT:   · Any allergies you have.  · All medicines you are taking, including vitamins, herbs, eye drops, creams, and over-the-counter medicines.  · Previous problems you or members of your family have had with the use of anesthetics.  · Any blood disorders you have.  · Previous surgeries you have had.  · Medical conditions you have.  RISKS AND COMPLICATIONS   Generally, this is a safe procedure. However, as with any procedure, complications can occur. Possible complications include:  · Bleeding.  · Tearing or rupture of the colon wall.  · Reaction to medicines given during the exam.  · Infection (rare).  BEFORE THE PROCEDURE   · Ask your health care provider about changing or stopping your regular medicines.  · You may be prescribed an oral bowel prep. This involves drinking a large amount of medicated liquid, starting the day before your procedure. The liquid will cause you to have multiple loose stools until your stool is almost clear or light green. This cleans out your colon in preparation for the procedure.  · Do not eat or drink anything else once you have started the bowel prep, unless your health care provider tells you it is safe to do so.  · Arrange for someone to drive you home after the procedure.  PROCEDURE   · You will be given medicine to help you relax (sedative).  · You will lie on your side with your knees bent.  · A long, flexible tube with a light and camera on the end (colonoscope) will be inserted through the rectum and into the colon. The camera sends video back to a computer screen as it moves through the colon. The colonoscope also releases carbon dioxide gas to inflate the colon. This helps your health care provider see the area better.  · During  the exam, your health care provider may take a small tissue sample (biopsy) to be examined under a microscope if any abnormalities are found.  · The exam is finished when the entire colon has been viewed.  AFTER THE PROCEDURE   · Do not drive for 24 hours after the exam.  · You may have a small amount of blood in your stool.  · You may pass moderate amounts of gas and have mild abdominal cramping or bloating. This is caused by the gas used to inflate your colon during the exam.  · Ask when your test results will be ready and how you will get your results. Make sure you get your test results.  Document Released: 05/23/2000 Document Revised: 03/16/2013 Document Reviewed: 01/31/2013  ExitCare® Patient Information ©2015 [employer]. This information is not intended to replace advice given to you by your health care provider. Make sure you discuss any questions you have with your health care provider.

## 2014-06-06 LAB — CYTOLOGY - PAP

## 2014-06-09 HISTORY — PX: COLONOSCOPY: SHX174

## 2014-06-12 ENCOUNTER — Ambulatory Visit
Admission: RE | Admit: 2014-06-12 | Discharge: 2014-06-12 | Disposition: A | Discharge status: Home / Self Care | Payer: 59 | Source: Ambulatory Visit

## 2014-06-12 DIAGNOSIS — Z1231 Encounter for screening mammogram for malignant neoplasm of breast: Secondary | ICD-10-CM

## 2014-07-10 ENCOUNTER — Encounter: Payer: Self-pay | Admitting: Internal Medicine

## 2014-08-28 ENCOUNTER — Ambulatory Visit: Payer: 59 | Admitting: Gastroenterology

## 2014-09-12 ENCOUNTER — Encounter: Payer: Self-pay | Admitting: Internal Medicine

## 2014-09-12 ENCOUNTER — Ambulatory Visit (INDEPENDENT_AMBULATORY_CARE_PROVIDER_SITE_OTHER): Payer: Self-pay | Admitting: Internal Medicine

## 2014-09-12 VITALS — BP 124/80 | HR 68 | Ht 61.25 in | Wt 126.2 lb

## 2014-09-12 DIAGNOSIS — Z800 Family history of malignant neoplasm of digestive organs: Secondary | ICD-10-CM

## 2014-09-12 DIAGNOSIS — Z1211 Encounter for screening for malignant neoplasm of colon: Secondary | ICD-10-CM

## 2014-09-12 DIAGNOSIS — Z8371 Family history of colonic polyps: Secondary | ICD-10-CM

## 2014-09-12 NOTE — Progress Notes (Signed)
   Subjective:    Patient ID: Shelly Edwards, female    DOB: Nov 05, 1970, 44 y.o.   MRN: [id number] Cc: colon cancer screening - family hx HPI Pleasant 44 yo woman with family hx colon polyps and cancer. No active GI sxs. Dr. Caleen Jobs suggested she consider screening before 50.  Paternal grandmother had colon cancer at about 30. Mother had pre-cancerous polyps in 75's and father in early 64's  Medications, allergies, past medical history, past surgical history, family history and social history are reviewed and updated in the EMR.  Review of Systems Recent sesonal allergies and skin rask O/w negative    Objective:   Physical Exam BP 124/80 mmHg  Pulse 68  Ht 5' 1.25" (1.556 m)  Wt 126 lb 4 oz (57.267 kg)  BMI 23.65 kg/m2  LMP 09/10/2014 Lungs cta Cor s1s2 no rmg     Assessment & Plan:   1. Colon cancer screening   2. Family history of colon cancer   3. Family history of polyps in the colon    She has 2 first-degree relatives with a history of precancerous colon polyps, her mother in her 55s and her father in her 24s and her paternal grandmother had colon cancer at about 89. I think it is reasonable proceed with a colonoscopy now we will see what findings are they're determine the next time of colonoscopy, if no polyps probably around the time of 49. Further plans pending clinical course. I think she is at somewhat increased risk for colon cancer based upon this.  The risks and benefits as well as alternatives of endoscopic procedure(s) have been discussed and reviewed. All questions answered. The patient agrees to proceed.   I appreciate the opportunity to care for this patient. Cc: Uvaldo Rising, MD

## 2014-09-12 NOTE — Patient Instructions (Signed)
  You have been scheduled for a colonoscopy. Please follow written instructions given to you at your visit today.  Please pick up your prep supplies at the pharmacy within the next 1-3 days. If you use inhalers (even only as needed), please bring them with you on the day of your procedure. Your physician has requested that you go to www.startemmi.com and enter the access code given to you at your visit today. This web site gives a general overview about your procedure. However, you should still follow specific instructions given to you by our office regarding your preparation for the procedure.    I appreciate the opportunity to care for you.  

## 2014-09-20 ENCOUNTER — Encounter: Payer: Self-pay | Admitting: Internal Medicine

## 2014-09-26 ENCOUNTER — Encounter: Payer: Self-pay | Admitting: Internal Medicine

## 2014-09-26 ENCOUNTER — Ambulatory Visit (AMBULATORY_SURGERY_CENTER): Payer: 59 | Admitting: Internal Medicine

## 2014-09-26 VITALS — BP 113/76 | HR 60 | Temp 96.8°F | Resp 21 | Ht 61.0 in | Wt 126.0 lb

## 2014-09-26 DIAGNOSIS — Z8371 Family history of colonic polyps: Secondary | ICD-10-CM | POA: Diagnosis not present

## 2014-09-26 DIAGNOSIS — Z800 Family history of malignant neoplasm of digestive organs: Secondary | ICD-10-CM

## 2014-09-26 DIAGNOSIS — Z1211 Encounter for screening for malignant neoplasm of colon: Secondary | ICD-10-CM

## 2014-09-26 MED ORDER — SODIUM CHLORIDE 0.9 % IV SOLN: 500.0000 mL | INTRAVENOUS | Status: DC

## 2014-09-26 NOTE — Patient Instructions (Addendum)
Your colonoscopy was normal.  I am going to recommend you return in about 5 years - (04/2020 approximately).  I appreciate the opportunity to care for you. Gatha Mayer, MD, FACG  YOU HAD AN ENDOSCOPIC PROCEDURE TODAY AT [facility]:   Refer to the procedure report that was given to you for any specific questions about what was found during the examination.  If the procedure report does not answer your questions, please call your gastroenterologist to clarify.  If you requested that your care partner not be given the details of your procedure findings, then the procedure report has been included in a sealed envelope for you to review at your convenience later.  YOU SHOULD EXPECT: Some feelings of bloating in the abdomen. Passage of more gas than usual.  Walking can help get rid of the air that was put into your GI tract during the procedure and reduce the bloating. If you had a lower endoscopy (such as a colonoscopy or flexible sigmoidoscopy) you may notice spotting of blood in your stool or on the toilet paper. If you underwent a bowel prep for your procedure, you may not have a normal bowel movement for a few days.  Please Note:  You might notice some irritation and congestion in your nose or some drainage.  This is from the oxygen used during your procedure.  There is no need for concern and it should clear up in a day or so.  SYMPTOMS TO REPORT IMMEDIATELY:   Following lower endoscopy (colonoscopy or flexible sigmoidoscopy):  Excessive amounts of blood in the stool  Significant tenderness or worsening of abdominal pains  Swelling of the abdomen that is new, acute  Fever of 100F or higher    For urgent or emergent issues, a gastroenterologist can be reached at any hour by calling [phone].   DIET: Your first meal following the procedure should be a small meal and then it is ok to progress to your normal diet. Heavy or fried foods are harder to digest  and may make you feel nauseous or bloated.  Likewise, meals heavy in dairy and vegetables can increase bloating.  Drink plenty of fluids but you should avoid alcoholic beverages for 24 hours.  ACTIVITY:  You should plan to take it easy for the rest of today and you should NOT DRIVE or use heavy machinery until tomorrow (because of the sedation medicines used during the test).    FOLLOW UP: Our staff will call the number listed on your records the next business day following your procedure to check on you and address any questions or concerns that you may have regarding the information given to you following your procedure. If we do not reach you, we will leave a message.  However, if you are feeling well and you are not experiencing any problems, there is no need to return our call.  We will assume that you have returned to your regular daily activities without incident.  If any biopsies were taken you will be contacted by phone or by letter within the next 1-3 weeks.  Please call us at [phone] if you have not heard about the biopsies in 3 weeks.    SIGNATURES/CONFIDENTIALITY: You and/or your care partner have signed paperwork which will be entered into your electronic medical record.  These signatures attest to the fact that that the information above on your After Visit Summary has been reviewed and is understood.  Full responsibility of the  confidentiality of this discharge information lies with you and/or your care-partner.

## 2014-09-26 NOTE — Progress Notes (Signed)
Patient awakening,vss,report to rn 

## 2014-09-26 NOTE — Op Note (Addendum)
[facility]  [address] [place], [address]   COLONOSCOPY PROCEDURE REPORT  PATIENT: Shelly Edwards, Shelly Edwards  MR#: [id number] BIRTHDATE: March 27, 1971 , 43  yrs. old GENDER: female ENDOSCOPIST: Gatha Mayer, MD, [facility] PROCEDURE DATE:  09/26/2014 PROCEDURE:   Colonoscopy, screening First Screening Colonoscopy - Avg.  risk and is 50 yrs.  old or older Yes.  Prior Negative Screening - Now for repeat screening. N/A  History of Adenoma - Now for follow-up colonoscopy & has been > or = to 3 yrs.  N/A ASA CLASS:   Class II INDICATIONS:Screening for colonic neoplasia, FH Colon or Rectal Adenocarcinoma, and FH Colon Adenoma. MEDICATIONS: Propofol 300 mg IV and Monitored anesthesia care  DESCRIPTION OF PROCEDURE:   After the risks benefits and alternatives of the procedure were thoroughly explained, informed consent was obtained.  The digital rectal exam revealed no abnormalities of the rectum.   The [id number]  endoscope was introduced through the anus and advanced to the cecum, which was identified by both the appendix and ileocecal valve. No adverse events experienced.   The quality of the prep was (MiraLax was used) excellent.  The instrument was then slowly withdrawn as the colon was fully examined.      COLON FINDINGS: A normal appearing cecum, ileocecal valve, and appendiceal orifice were identified.  The ascending, transverse, descending, sigmoid colon, and rectum appeared unremarkable. Retroflexed views revealed no abnormalities. The time to cecum = 3.6 Withdrawal time = 10.1   The scope was withdrawn and the procedure completed. COMPLICATIONS: There were no immediate complications.  ENDOSCOPIC IMPRESSION: Normal colonoscopy - excellent prep  RECOMMENDATIONS: Repeat colonoscopy in 5 years. (04/2020 recall) in patient with PGM w/ CRCA, mother and father w/ polyps  eSigned:  Gatha Mayer, MD, [facility] 09/26/2014 10:18 AM Revised: 09/26/2014 10:18  AM  cc: The Patient and Dr. Uvaldo Rising

## 2014-09-27 ENCOUNTER — Telehealth: Payer: Self-pay

## 2014-09-27 NOTE — Telephone Encounter (Signed)
  Follow up Call-  Call back number 09/26/2014  Post procedure Call Back phone  # [phone]  Permission to leave phone message Yes     Patient questions:  Do you have a fever, pain , or abdominal swelling? No. Pain Score  0 *  Have you tolerated food without any problems? Yes.    Have you been able to return to your normal activities? Yes.    Do you have any questions about your discharge instructions: Diet   No. Medications  No. Follow up visit  No.  Do you have questions or concerns about your Care? No.  Actions: * If pain score is 4 or above: No action needed, pain <4.

## 2015-05-29 ENCOUNTER — Encounter: Payer: Self-pay | Admitting: Gynecology

## 2015-05-29 ENCOUNTER — Ambulatory Visit (INDEPENDENT_AMBULATORY_CARE_PROVIDER_SITE_OTHER): Payer: 59 | Admitting: Gynecology

## 2015-05-29 VITALS — BP 110/70 | Ht 61.5 in | Wt 122.0 lb

## 2015-05-29 DIAGNOSIS — Z01419 Encounter for gynecological examination (general) (routine) without abnormal findings: Secondary | ICD-10-CM | POA: Diagnosis not present

## 2015-05-29 NOTE — Progress Notes (Signed)
Shelly Edwards 1970-12-26 [id number]   History:    44 y.o.  for annual gyn exam with no complaints today. Patient reports normal menstrual cycles. Her husband has had a vasectomy. Patient has breast implants. Her GI history is as follows: Patient has informed me of the following family history: Both mother and father had history of benign colon polyps Grandmother on father's side had colon cancer history Grandmother on mother's side had history of breast cancer  Patient had a normal colonoscopy this year. Patient with no previous history of any abnormal Pap smears. Patient's flu vaccine was administered early this year. Her PCP Dr. [place] at [facility] has been doing her blood work.  Past medical history,surgical history, family history and social history were all reviewed and documented in the EPIC chart.  Gynecologic History Patient's last menstrual period was 05/06/2015. Contraception: vasectomy Last Pap: 2015. Results were: normal Last mammogram: 2016. Results were: Three-dimensional mammogram normal as a result of her breast implants and dense breasts  Obstetric History OB History  Gravida Para Term Preterm AB SAB TAB Ectopic Multiple Living  2 2 2       2     # Outcome Date GA Lbr Len/2nd Weight Sex Delivery Anes PTL Lv  2 Term     M Vag-Spont  N Y  1 Term     M Vag-Spont  N Y       ROS: A ROS was performed and pertinent positives and negatives are included in the history.  GENERAL: No fevers or chills. HEENT: No change in vision, no earache, sore throat or sinus congestion. NECK: No pain or stiffness. CARDIOVASCULAR: No chest pain or pressure. No palpitations. PULMONARY: No shortness of breath, cough or wheeze. GASTROINTESTINAL: No abdominal pain, nausea, vomiting or diarrhea, melena or bright red blood per rectum. GENITOURINARY: No urinary frequency, urgency, hesitancy or dysuria. MUSCULOSKELETAL: No joint or muscle pain, no back pain, no recent  trauma. DERMATOLOGIC: No rash, no itching, no lesions. ENDOCRINE: No polyuria, polydipsia, no heat or cold intolerance. No recent change in weight. HEMATOLOGICAL: No anemia or easy bruising or bleeding. NEUROLOGIC: No headache, seizures, numbness, tingling or weakness. PSYCHIATRIC: No depression, no loss of interest in normal activity or change in sleep pattern.     Exam: chaperone present  BP 110/70 mmHg  Ht 5' 1.5" (1.562 m)  Wt 122 lb (55.339 kg)  BMI 22.68 kg/m2  LMP 05/06/2015  Body mass index is 22.68 kg/(m^2).  General appearance : Well developed well nourished female. No acute distress HEENT: Eyes: no retinal hemorrhage or exudates,  Neck supple, trachea midline, no carotid bruits, no thyroidmegaly Lungs: Clear to auscultation, no rhonchi or wheezes, or rib retractions  Heart: Regular rate and rhythm, no murmurs or gallops Breast:Examined in sitting and supine position were symmetrical in appearance, no palpable masses or tenderness,  no skin retraction, no nipple inversion, no nipple discharge, no skin discoloration, no axillary or supraclavicular lymphadenopathy Abdomen: no palpable masses or tenderness, no rebound or guarding Extremities: no edema or skin discoloration or tenderness  Pelvic:  Bartholin, Urethra, Skene Glands: Within normal limits             Vagina: No gross lesions or discharge  Cervix: No gross lesions or discharge  Uterus  anteverted, normal size, shape and consistency, non-tender and mobile  Adnexa  Without masses or tenderness  Anus and perineum  normal   Rectovaginal  normal sphincter tone without palpated masses or tenderness  Hemoccult not indicated     Assessment/Plan:  44 y.o. female for annual exam with no complaints today. Normal examination today. We discussed importance of monthly breast exam. I have requested that when her primary care physician does her blood work to afford [place] a copy so that we can scan and make a part of her  electronic medical records. Pap smear not indicated this year. Flu vaccine up-to-date.   Terrance Mass MD, 2:16 PM 05/29/2015

## 2016-03-24 ENCOUNTER — Other Ambulatory Visit: Payer: Self-pay | Admitting: Gynecology

## 2016-03-24 DIAGNOSIS — Z9882 Breast implant status: Secondary | ICD-10-CM

## 2016-03-24 DIAGNOSIS — Z1231 Encounter for screening mammogram for malignant neoplasm of breast: Secondary | ICD-10-CM

## 2016-04-10 ENCOUNTER — Ambulatory Visit
Admission: RE | Admit: 2016-04-10 | Discharge: 2016-04-10 | Disposition: A | Discharge status: Home / Self Care | Payer: 59 | Source: Ambulatory Visit | Attending: Gynecology | Admitting: Gynecology

## 2016-04-10 DIAGNOSIS — Z9882 Breast implant status: Secondary | ICD-10-CM

## 2016-04-10 DIAGNOSIS — Z1231 Encounter for screening mammogram for malignant neoplasm of breast: Secondary | ICD-10-CM

## 2016-05-29 ENCOUNTER — Ambulatory Visit (INDEPENDENT_AMBULATORY_CARE_PROVIDER_SITE_OTHER): Payer: 59 | Admitting: Gynecology

## 2016-05-29 ENCOUNTER — Encounter: Payer: Self-pay | Admitting: Gynecology

## 2016-05-29 VITALS — BP 128/84 | Ht 61.5 in | Wt 117.0 lb

## 2016-05-29 DIAGNOSIS — Z01419 Encounter for gynecological examination (general) (routine) without abnormal findings: Secondary | ICD-10-CM | POA: Diagnosis not present

## 2016-05-29 NOTE — Progress Notes (Signed)
Shelly Edwards [birth year] [id number]   History:    45 y.o.  for annual gyn exam who is asymptomatic. Patient reports normal light menstrual cycles. Patient has breast implants. Her GI history is as follows: Patient has informed me of the following family history: Both mother and father had history of benign colon polyps Grandmother on father's side had colon cancer history Grandmother on mother's side had history of breast cancer  Patient had normal colonoscopy in 2016. Patient with no previous history of any abnormal Pap smear. Her PCP has been doing her blood work and her vaccines are up-to-date.  Patient with breast implants and dense breasts history.  Past medical history,surgical history, family history and social history were all reviewed and documented in the EPIC chart.  Gynecologic History Patient's last menstrual period was 05/10/2016. Contraception: vasectomy Last Pap: 2015. Results were: normal Last mammogram: 2017. Results were: Normal but dense had three-dimensional mammogram  Obstetric History OB History  Gravida Para Term Preterm AB Living  2 2 2     2   SAB TAB Ectopic Multiple Live Births          2    # Outcome Date GA Lbr Len/2nd Weight Sex Delivery Anes PTL Lv  2 Term     M Vag-Spont  N LIV  1 Term     M Vag-Spont  N LIV       ROS: A ROS was performed and pertinent positives and negatives are included in the history.  GENERAL: No fevers or chills. HEENT: No change in vision, no earache, sore throat or sinus congestion. NECK: No pain or stiffness. CARDIOVASCULAR: No chest pain or pressure. No palpitations. PULMONARY: No shortness of breath, cough or wheeze. GASTROINTESTINAL: No abdominal pain, nausea, vomiting or diarrhea, melena or bright red blood per rectum. GENITOURINARY: No urinary frequency, urgency, hesitancy or dysuria. MUSCULOSKELETAL: No joint or muscle pain, no back pain, no recent trauma. DERMATOLOGIC: No rash, no itching, no lesions.  ENDOCRINE: No polyuria, polydipsia, no heat or cold intolerance. No recent change in weight. HEMATOLOGICAL: No anemia or easy bruising or bleeding. NEUROLOGIC: No headache, seizures, numbness, tingling or weakness. PSYCHIATRIC: No depression, no loss of interest in normal activity or change in sleep pattern.     Exam: chaperone present  BP 128/84   Ht 5' 1.5" (1.562 m)   Wt 117 lb (53.1 kg)   LMP 05/10/2016   BMI 21.75 kg/m   Body mass index is 21.75 kg/m.  General appearance : Well developed well nourished female. No acute distress HEENT: Eyes: no retinal hemorrhage or exudates,  Neck supple, trachea midline, no carotid bruits, no thyroidmegaly Lungs: Clear to auscultation, no rhonchi or wheezes, or rib retractions  Heart: Regular rate and rhythm, no murmurs or gallops Breast:Examined in sitting and supine position were symmetrical in appearance, no palpable masses or tenderness,  no skin retraction, no nipple inversion, no nipple discharge, no skin discoloration, no axillary or supraclavicular lymphadenopathy Abdomen: no palpable masses or tenderness, no rebound or guarding Extremities: no edema or skin discoloration or tenderness  Pelvic:  Bartholin, Urethra, Skene Glands: Within normal limits             Vagina: No gross lesions or discharge  Cervix: No gross lesions or discharge  Uterus  anteverted, normal size, shape and consistency, non-tender and mobile  Adnexa  Without masses or tenderness  Anus and perineum  normal   Rectovaginal  normal sphincter tone without palpated masses or tenderness  Hemoccult not indicated     Assessment/Plan:  45 y.o. female for annual exam will return to the office next week in a fasting state for the following screening blood work: Comprehensive metabolic panel, fasting lipid profile, TSH, CBC, and urinalysis. Pap smear not done this year.   Terrance Mass MD, 10:16 AM 05/29/2016

## 2016-05-30 LAB — URINALYSIS W MICROSCOPIC + REFLEX CULTURE
Bacteria, UA: NONE SEEN [HPF]
Bilirubin Urine: NEGATIVE
Casts: NONE SEEN [LPF]
Crystals: NONE SEEN [HPF]
Glucose, UA: NEGATIVE
Hgb urine dipstick: NEGATIVE
Leukocytes, UA: NEGATIVE
Nitrite: NEGATIVE
Protein, ur: NEGATIVE
RBC / HPF: NONE SEEN RBC/HPF (ref <=2)
Specific Gravity, Urine: 1.013 (ref 1.001–1.035)
Squamous Epithelial / LPF: NONE SEEN [HPF] (ref <=5)
WBC, UA: NONE SEEN WBC/HPF (ref <=5)
Yeast: NONE SEEN [HPF]
pH: 7 (ref 5.0–8.0)

## 2016-06-04 ENCOUNTER — Other Ambulatory Visit: Payer: 59

## 2016-06-05 ENCOUNTER — Other Ambulatory Visit: Payer: 59

## 2016-06-05 LAB — COMPREHENSIVE METABOLIC PANEL
ALT: 20 U/L (ref 6–29)
AST: 50 U/L — ABNORMAL HIGH (ref 10–35)
Albumin: 3.7 g/dL (ref 3.6–5.1)
Alkaline Phosphatase: 50 U/L (ref 33–115)
BUN: 7 mg/dL (ref 7–25)
CO2: 24 mmol/L (ref 20–31)
Calcium: 8.2 mg/dL — ABNORMAL LOW (ref 8.6–10.2)
Chloride: 106 mmol/L (ref 98–110)
Creat: 0.62 mg/dL (ref 0.50–1.10)
Glucose, Bld: 73 mg/dL (ref 65–99)
Potassium: 3.9 mmol/L (ref 3.5–5.3)
Sodium: 140 mmol/L (ref 135–146)
Total Bilirubin: 0.5 mg/dL (ref 0.2–1.2)
Total Protein: 6.3 g/dL (ref 6.1–8.1)

## 2016-06-05 LAB — CBC WITH DIFFERENTIAL/PLATELET
Basophils Absolute: 42 cells/uL (ref 0–200)
Basophils Relative: 1 %
Eosinophils Absolute: 84 cells/uL (ref 15–500)
Eosinophils Relative: 2 %
HCT: 39.7 % (ref 35.0–45.0)
Hemoglobin: 13.4 g/dL (ref 11.7–15.5)
Lymphocytes Relative: 33 %
Lymphs Abs: 1386 cells/uL (ref 850–3900)
MCH: 32.1 pg (ref 27.0–33.0)
MCHC: 33.8 g/dL (ref 32.0–36.0)
MCV: 95.2 fL (ref 80.0–100.0)
MPV: 9.1 fL (ref 7.5–12.5)
Monocytes Absolute: 546 cells/uL (ref 200–950)
Monocytes Relative: 13 %
Neutro Abs: 2142 cells/uL (ref 1500–7800)
Neutrophils Relative %: 51 %
Platelets: 221 10*3/uL (ref 140–400)
RBC: 4.17 MIL/uL (ref 3.80–5.10)
RDW: 13.9 % (ref 11.0–15.0)
WBC: 4.2 10*3/uL (ref 3.8–10.8)

## 2016-06-05 LAB — LIPID PANEL
Cholesterol: 204 mg/dL — ABNORMAL HIGH (ref <200)
HDL: 136 mg/dL (ref >50)
LDL Cholesterol: 41 mg/dL (ref <100)
Total CHOL/HDL Ratio: 1.5 Ratio (ref <5.0)
Triglycerides: 133 mg/dL (ref <150)
VLDL: 27 mg/dL (ref <30)

## 2016-06-05 LAB — TSH: TSH: 1.25 mIU/L

## 2016-06-06 ENCOUNTER — Other Ambulatory Visit: Payer: Self-pay | Admitting: Gynecology

## 2016-06-06 DIAGNOSIS — R945 Abnormal results of liver function studies: Principal | ICD-10-CM

## 2016-06-06 DIAGNOSIS — R7989 Other specified abnormal findings of blood chemistry: Secondary | ICD-10-CM

## 2016-06-12 DIAGNOSIS — J301 Allergic rhinitis due to pollen: Secondary | ICD-10-CM | POA: Diagnosis not present

## 2016-06-12 DIAGNOSIS — J3089 Other allergic rhinitis: Secondary | ICD-10-CM | POA: Diagnosis not present

## 2016-06-12 DIAGNOSIS — J3081 Allergic rhinitis due to animal (cat) (dog) hair and dander: Secondary | ICD-10-CM | POA: Diagnosis not present

## 2016-06-20 DIAGNOSIS — J3081 Allergic rhinitis due to animal (cat) (dog) hair and dander: Secondary | ICD-10-CM | POA: Diagnosis not present

## 2016-06-20 DIAGNOSIS — J301 Allergic rhinitis due to pollen: Secondary | ICD-10-CM | POA: Diagnosis not present

## 2016-06-20 DIAGNOSIS — J3089 Other allergic rhinitis: Secondary | ICD-10-CM | POA: Diagnosis not present

## 2016-06-27 DIAGNOSIS — J3081 Allergic rhinitis due to animal (cat) (dog) hair and dander: Secondary | ICD-10-CM | POA: Diagnosis not present

## 2016-06-27 DIAGNOSIS — J301 Allergic rhinitis due to pollen: Secondary | ICD-10-CM | POA: Diagnosis not present

## 2016-06-27 DIAGNOSIS — J3089 Other allergic rhinitis: Secondary | ICD-10-CM | POA: Diagnosis not present

## 2016-07-04 DIAGNOSIS — J3081 Allergic rhinitis due to animal (cat) (dog) hair and dander: Secondary | ICD-10-CM | POA: Diagnosis not present

## 2016-07-04 DIAGNOSIS — J3089 Other allergic rhinitis: Secondary | ICD-10-CM | POA: Diagnosis not present

## 2016-07-10 DIAGNOSIS — J301 Allergic rhinitis due to pollen: Secondary | ICD-10-CM | POA: Diagnosis not present

## 2016-07-18 DIAGNOSIS — J301 Allergic rhinitis due to pollen: Secondary | ICD-10-CM | POA: Diagnosis not present

## 2016-07-18 DIAGNOSIS — J3081 Allergic rhinitis due to animal (cat) (dog) hair and dander: Secondary | ICD-10-CM | POA: Diagnosis not present

## 2016-07-18 DIAGNOSIS — J3089 Other allergic rhinitis: Secondary | ICD-10-CM | POA: Diagnosis not present

## 2016-07-25 DIAGNOSIS — J3089 Other allergic rhinitis: Secondary | ICD-10-CM | POA: Diagnosis not present

## 2016-07-25 DIAGNOSIS — J301 Allergic rhinitis due to pollen: Secondary | ICD-10-CM | POA: Diagnosis not present

## 2016-07-25 DIAGNOSIS — J3081 Allergic rhinitis due to animal (cat) (dog) hair and dander: Secondary | ICD-10-CM | POA: Diagnosis not present

## 2016-08-01 DIAGNOSIS — J3089 Other allergic rhinitis: Secondary | ICD-10-CM | POA: Diagnosis not present

## 2016-08-01 DIAGNOSIS — J3081 Allergic rhinitis due to animal (cat) (dog) hair and dander: Secondary | ICD-10-CM | POA: Diagnosis not present

## 2016-08-01 DIAGNOSIS — J301 Allergic rhinitis due to pollen: Secondary | ICD-10-CM | POA: Diagnosis not present

## 2016-08-11 DIAGNOSIS — J3081 Allergic rhinitis due to animal (cat) (dog) hair and dander: Secondary | ICD-10-CM | POA: Diagnosis not present

## 2016-08-11 DIAGNOSIS — J3089 Other allergic rhinitis: Secondary | ICD-10-CM | POA: Diagnosis not present

## 2016-08-15 DIAGNOSIS — J3089 Other allergic rhinitis: Secondary | ICD-10-CM | POA: Diagnosis not present

## 2016-08-15 DIAGNOSIS — J3081 Allergic rhinitis due to animal (cat) (dog) hair and dander: Secondary | ICD-10-CM | POA: Diagnosis not present

## 2016-08-15 DIAGNOSIS — J301 Allergic rhinitis due to pollen: Secondary | ICD-10-CM | POA: Diagnosis not present

## 2016-09-04 DIAGNOSIS — J3081 Allergic rhinitis due to animal (cat) (dog) hair and dander: Secondary | ICD-10-CM | POA: Diagnosis not present

## 2016-09-04 DIAGNOSIS — J3089 Other allergic rhinitis: Secondary | ICD-10-CM | POA: Diagnosis not present

## 2016-09-04 DIAGNOSIS — J301 Allergic rhinitis due to pollen: Secondary | ICD-10-CM | POA: Diagnosis not present

## 2016-09-12 DIAGNOSIS — J3089 Other allergic rhinitis: Secondary | ICD-10-CM | POA: Diagnosis not present

## 2016-09-12 DIAGNOSIS — J3081 Allergic rhinitis due to animal (cat) (dog) hair and dander: Secondary | ICD-10-CM | POA: Diagnosis not present

## 2016-09-12 DIAGNOSIS — J301 Allergic rhinitis due to pollen: Secondary | ICD-10-CM | POA: Diagnosis not present

## 2016-09-22 DIAGNOSIS — J3081 Allergic rhinitis due to animal (cat) (dog) hair and dander: Secondary | ICD-10-CM | POA: Diagnosis not present

## 2016-09-22 DIAGNOSIS — J3089 Other allergic rhinitis: Secondary | ICD-10-CM | POA: Diagnosis not present

## 2016-09-22 DIAGNOSIS — J301 Allergic rhinitis due to pollen: Secondary | ICD-10-CM | POA: Diagnosis not present

## 2016-10-03 DIAGNOSIS — J301 Allergic rhinitis due to pollen: Secondary | ICD-10-CM | POA: Diagnosis not present

## 2016-10-03 DIAGNOSIS — J3089 Other allergic rhinitis: Secondary | ICD-10-CM | POA: Diagnosis not present

## 2016-10-03 DIAGNOSIS — J3081 Allergic rhinitis due to animal (cat) (dog) hair and dander: Secondary | ICD-10-CM | POA: Diagnosis not present

## 2016-10-13 DIAGNOSIS — J301 Allergic rhinitis due to pollen: Secondary | ICD-10-CM | POA: Diagnosis not present

## 2016-10-13 DIAGNOSIS — J3081 Allergic rhinitis due to animal (cat) (dog) hair and dander: Secondary | ICD-10-CM | POA: Diagnosis not present

## 2016-10-13 DIAGNOSIS — J3089 Other allergic rhinitis: Secondary | ICD-10-CM | POA: Diagnosis not present

## 2016-10-22 ENCOUNTER — Encounter: Payer: Self-pay | Admitting: Gynecology

## 2016-10-28 DIAGNOSIS — J3089 Other allergic rhinitis: Secondary | ICD-10-CM | POA: Diagnosis not present

## 2016-10-28 DIAGNOSIS — J3081 Allergic rhinitis due to animal (cat) (dog) hair and dander: Secondary | ICD-10-CM | POA: Diagnosis not present

## 2016-10-28 DIAGNOSIS — J301 Allergic rhinitis due to pollen: Secondary | ICD-10-CM | POA: Diagnosis not present

## 2016-10-30 DIAGNOSIS — J3089 Other allergic rhinitis: Secondary | ICD-10-CM | POA: Diagnosis not present

## 2016-10-30 DIAGNOSIS — J301 Allergic rhinitis due to pollen: Secondary | ICD-10-CM | POA: Diagnosis not present

## 2016-10-30 DIAGNOSIS — J3081 Allergic rhinitis due to animal (cat) (dog) hair and dander: Secondary | ICD-10-CM | POA: Diagnosis not present

## 2016-11-05 DIAGNOSIS — R350 Frequency of micturition: Secondary | ICD-10-CM | POA: Diagnosis not present

## 2016-11-07 DIAGNOSIS — J301 Allergic rhinitis due to pollen: Secondary | ICD-10-CM | POA: Diagnosis not present

## 2016-11-07 DIAGNOSIS — J3081 Allergic rhinitis due to animal (cat) (dog) hair and dander: Secondary | ICD-10-CM | POA: Diagnosis not present

## 2016-11-07 DIAGNOSIS — J3089 Other allergic rhinitis: Secondary | ICD-10-CM | POA: Diagnosis not present

## 2016-11-14 DIAGNOSIS — J3081 Allergic rhinitis due to animal (cat) (dog) hair and dander: Secondary | ICD-10-CM | POA: Diagnosis not present

## 2016-11-14 DIAGNOSIS — J3089 Other allergic rhinitis: Secondary | ICD-10-CM | POA: Diagnosis not present

## 2016-11-14 DIAGNOSIS — J301 Allergic rhinitis due to pollen: Secondary | ICD-10-CM | POA: Diagnosis not present

## 2016-11-18 DIAGNOSIS — R8290 Unspecified abnormal findings in urine: Secondary | ICD-10-CM | POA: Diagnosis not present

## 2016-12-05 DIAGNOSIS — J3081 Allergic rhinitis due to animal (cat) (dog) hair and dander: Secondary | ICD-10-CM | POA: Diagnosis not present

## 2016-12-05 DIAGNOSIS — J3089 Other allergic rhinitis: Secondary | ICD-10-CM | POA: Diagnosis not present

## 2016-12-05 DIAGNOSIS — J301 Allergic rhinitis due to pollen: Secondary | ICD-10-CM | POA: Diagnosis not present

## 2017-02-05 DIAGNOSIS — J3089 Other allergic rhinitis: Secondary | ICD-10-CM | POA: Diagnosis not present

## 2017-02-05 DIAGNOSIS — J301 Allergic rhinitis due to pollen: Secondary | ICD-10-CM | POA: Diagnosis not present

## 2017-02-05 DIAGNOSIS — J3081 Allergic rhinitis due to animal (cat) (dog) hair and dander: Secondary | ICD-10-CM | POA: Diagnosis not present

## 2017-02-17 DIAGNOSIS — J3089 Other allergic rhinitis: Secondary | ICD-10-CM | POA: Diagnosis not present

## 2017-02-17 DIAGNOSIS — J301 Allergic rhinitis due to pollen: Secondary | ICD-10-CM | POA: Diagnosis not present

## 2017-02-17 DIAGNOSIS — J3081 Allergic rhinitis due to animal (cat) (dog) hair and dander: Secondary | ICD-10-CM | POA: Diagnosis not present

## 2017-02-24 DIAGNOSIS — J3089 Other allergic rhinitis: Secondary | ICD-10-CM | POA: Diagnosis not present

## 2017-02-24 DIAGNOSIS — J3081 Allergic rhinitis due to animal (cat) (dog) hair and dander: Secondary | ICD-10-CM | POA: Diagnosis not present

## 2017-02-24 DIAGNOSIS — J301 Allergic rhinitis due to pollen: Secondary | ICD-10-CM | POA: Diagnosis not present

## 2017-03-03 DIAGNOSIS — R5383 Other fatigue: Secondary | ICD-10-CM | POA: Diagnosis not present

## 2017-03-03 DIAGNOSIS — R52 Pain, unspecified: Secondary | ICD-10-CM | POA: Diagnosis not present

## 2017-03-04 DIAGNOSIS — J3081 Allergic rhinitis due to animal (cat) (dog) hair and dander: Secondary | ICD-10-CM | POA: Diagnosis not present

## 2017-03-04 DIAGNOSIS — J3089 Other allergic rhinitis: Secondary | ICD-10-CM | POA: Diagnosis not present

## 2017-03-04 DIAGNOSIS — Z23 Encounter for immunization: Secondary | ICD-10-CM | POA: Diagnosis not present

## 2017-03-04 DIAGNOSIS — J301 Allergic rhinitis due to pollen: Secondary | ICD-10-CM | POA: Diagnosis not present

## 2017-03-06 DIAGNOSIS — J3081 Allergic rhinitis due to animal (cat) (dog) hair and dander: Secondary | ICD-10-CM | POA: Diagnosis not present

## 2017-03-06 DIAGNOSIS — J301 Allergic rhinitis due to pollen: Secondary | ICD-10-CM | POA: Diagnosis not present

## 2017-03-06 DIAGNOSIS — J3089 Other allergic rhinitis: Secondary | ICD-10-CM | POA: Diagnosis not present

## 2017-03-09 DIAGNOSIS — E559 Vitamin D deficiency, unspecified: Secondary | ICD-10-CM | POA: Insufficient documentation

## 2017-03-10 DIAGNOSIS — E538 Deficiency of other specified B group vitamins: Secondary | ICD-10-CM | POA: Diagnosis not present

## 2017-03-23 ENCOUNTER — Ambulatory Visit (INDEPENDENT_AMBULATORY_CARE_PROVIDER_SITE_OTHER): Payer: 59 | Admitting: Family Medicine

## 2017-03-23 ENCOUNTER — Encounter: Payer: Self-pay | Admitting: Family Medicine

## 2017-03-23 VITALS — BP 118/76 | HR 75 | Resp 12 | Ht 61.5 in | Wt 119.2 lb

## 2017-03-23 DIAGNOSIS — E538 Deficiency of other specified B group vitamins: Secondary | ICD-10-CM

## 2017-03-23 DIAGNOSIS — M549 Dorsalgia, unspecified: Secondary | ICD-10-CM | POA: Diagnosis not present

## 2017-03-23 DIAGNOSIS — M791 Myalgia, unspecified site: Secondary | ICD-10-CM | POA: Diagnosis not present

## 2017-03-23 DIAGNOSIS — R5383 Other fatigue: Secondary | ICD-10-CM | POA: Diagnosis not present

## 2017-03-23 DIAGNOSIS — J301 Allergic rhinitis due to pollen: Secondary | ICD-10-CM | POA: Diagnosis not present

## 2017-03-23 DIAGNOSIS — J3081 Allergic rhinitis due to animal (cat) (dog) hair and dander: Secondary | ICD-10-CM | POA: Diagnosis not present

## 2017-03-23 DIAGNOSIS — E559 Vitamin D deficiency, unspecified: Secondary | ICD-10-CM

## 2017-03-23 DIAGNOSIS — J3089 Other allergic rhinitis: Secondary | ICD-10-CM | POA: Diagnosis not present

## 2017-03-23 LAB — SEDIMENTATION RATE: Sed Rate: 2 mm/hr (ref 0–20)

## 2017-03-23 LAB — CK: Total CK: 51 U/L (ref 7–177)

## 2017-03-23 LAB — HEMOGLOBIN A1C: Hgb A1c MFr Bld: 5 % (ref 4.6–6.5)

## 2017-03-23 LAB — C-REACTIVE PROTEIN: CRP: 0.1 mg/dL — ABNORMAL LOW (ref 0.5–20.0)

## 2017-03-23 MED ORDER — CYANOCOBALAMIN 1000 MCG/ML IJ SOLN
1000.0000 ug | Freq: Once | INTRAMUSCULAR | Status: AC
  Administered 2017-03-23: 1000 ug via INTRAMUSCULAR

## 2017-03-23 NOTE — Patient Instructions (Addendum)
A few things to remember from today's visit:   Fatigue, unspecified type - Plan: Sedimentation rate, C-reactive protein, Hemoglobin A1c, ANA,IFA RA Diag Pnl w/rflx Tit/Patn  Myalgia - Plan: CK, ANA,IFA RA Diag Pnl w/rflx Tit/Patn  Bilateral back pain, unspecified back location, unspecified chronicity - Plan: DG Lumbar Spine Complete  B12 deficiency - Plan: cyanocobalamin ((VITAMIN B-12)) injection 1,000 mcg  Vitamin D deficiency, unspecified    It is a common symptom associated with multiple factors: psychologic,medications, systemic illness, sleep disorders,infections, and unknown causes. Some work-up can be done to evaluate for common causes as thyroid disease,anemia,diabetes, or abnormalities in calcium,potassium,or sodium. Regular physical activity as tolerated and a healthy diet is usually might help and usually recommended for chronic fatigue.  Please be sure medication list is accurate. If a new problem present, please set up appointment sooner than planned today.

## 2017-03-23 NOTE — Progress Notes (Signed)
HPI:   Shelly Edwards is a 46 y.o. female, who is here today to establish care.  Former PCP: Development worker, community, [place]. I have seen Ms Shelly Edwards at [place] about 2 years ago. Last preventive routine visit: 05/2016. Gyn, Dr Toney Rakes, next appt in 05/2017.  Chronic medical problems: Asthma, allergic rhinitis, and eczema.  She follows with immunology for asthma and allergic rhinitis. Concerns today: Fatigue and pain all over.   Today she is c/o 2-3 months of fatigue and myalgias, achy,sore muscles, it is worse on her back. Symptoms are intermittently, she has not identified exacerbating or alleviating factors. But mentions that it seems to be worse after eating. In between episodes she may have 4-5 days of feeling "pretty good." She denies any unusual stress, depression, or anxiety. Past Hx of eating disorder. She denies problems with sleep, no history of sleep apnea. She follows a healthy diet, vegetarian, she is eating less dairy products that she did before. She is also exercising regularly. Denies arthralgias or joint edema/erythema, oral lesions, or skin rash.  She denies any fever, chills, chest pain, cough, dyspnea, wheezing, abdominal pain, blood in the stool, or urinary symptoms. She has had a few episode of soft stools, which she has had occasionally for a while. Colonoscopy done because FHx of colon cancer.  On 02/27/2017 former PCP ordered lab work: CMP, [place], 25 OH vit D. workup otherwise negative except for B-12 low at 82, she already received a B12 injection a few weeks ago. 25 OH vit D 24.1, on Vit D supplementation (Ergocalciferol 50,000 U weekly). Glucose 69 and AST 46. She denies high alcohol intake or history of alcohol abuse.   Review of Systems  Constitutional: Positive for fatigue. Negative for activity change, appetite change, fever and unexpected weight change.  HENT: Negative for mouth sores, nosebleeds and trouble swallowing.   Eyes:  Negative for redness and visual disturbance.  Respiratory: Negative for cough, shortness of breath and wheezing.   Cardiovascular: Negative for chest pain, palpitations and leg swelling.  Gastrointestinal: Negative for abdominal pain, blood in stool, nausea and vomiting.       Occasional softer stools.  Endocrine: Negative for cold intolerance, heat intolerance, polydipsia, polyphagia and polyuria.  Genitourinary: Negative for decreased urine volume, dysuria and hematuria.  Musculoskeletal: Positive for back pain and myalgias. Negative for gait problem.  Skin: Negative for pallor and rash.       Hair thinning.   Allergic/Immunologic: Positive for environmental allergies.  Neurological: Negative for syncope, weakness, numbness and headaches.  Hematological: Negative for adenopathy. Does not bruise/bleed easily.  Psychiatric/Behavioral: Negative for confusion and sleep disturbance. The patient is nervous/anxious.       Current Outpatient Prescriptions on File Prior to Visit  Medication Sig Dispense Refill  . ADVAIR DISKUS 100-50 MCG/DOSE AEPB Inhale 1 puff into the lungs 2 (two) times daily.     . clobetasol ointment (TEMOVATE) 5.09 % Apply 1 application topically 2 (two) times daily.    . diphenhydrAMINE (BENADRYL) 25 MG tablet Take 25 mg by mouth as needed.    . fluticasone (FLONASE) 50 MCG/ACT nasal spray Place into both nostrils daily.    Marland Kitchen loratadine (CLARITIN) 10 MG tablet Take 10 mg by mouth daily. Reported on 05/29/2015    . VENTOLIN HFA 108 (90 BASE) MCG/ACT inhaler Inhale 1-2 puffs into the lungs as needed. Reported on 05/29/2015     No current facility-administered medications on file prior to visit.  Past Medical History:  Diagnosis Date  . Allergic rhinitis   . Asthma    No Known Allergies  Family History  Problem Relation Age of Onset  . Hypertension Mother   . Colon polyps Mother        pre-cancerous polyps  . Hypertension Father   . Heart disease Father     . Prostate cancer Father   . Colon polyps Father        pre-cancerous polyps  . Breast cancer Maternal Grandmother   . Colon cancer Paternal Grandmother 33  . Kidney disease Neg Hx   . Gallbladder disease Neg Hx   . Esophageal cancer Neg Hx     Social History   Social History  . Marital status: Legally Separated    Spouse name: N/A  . Number of children: 2  . Years of education: N/A   Occupational History  . [place] History Main Topics  . Smoking status: Former Smoker    Quit date: 05/28/1987  . Smokeless tobacco: Never Used  . Alcohol use 0.0 oz/week     Comment: occ  . Drug use: No  . Sexual activity: Yes     Comment: patients husband with vasectomy   Other Topics Concern  . None   Social History Narrative   Married, 2 sons   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    Vitals:   03/23/17 1006  BP: 118/76  Pulse: 75  Resp: 12  SpO2: 98%    Body mass index is 22.17 kg/m.   Physical Exam  Nursing note and vitals reviewed. Constitutional: She is oriented to person, place, and time. She appears well-developed and well-nourished. No distress.  HENT:  Head: Normocephalic and atraumatic.  Mouth/Throat: Oropharynx is clear and moist and mucous membranes are normal.  Eyes: Pupils are equal, round, and reactive to light. Conjunctivae and EOM are normal.  Neck: No tracheal deviation present. No thyroid mass and no thyromegaly present.  Cardiovascular: Normal rate and regular rhythm.   No murmur heard. Pulses:      Dorsalis pedis pulses are 2+ on the right side, and 2+ on the left side.  Respiratory: Effort normal and breath sounds normal. No respiratory distress.  GI: Soft. She exhibits no mass. There is no hepatomegaly. There is no tenderness.  Musculoskeletal: She exhibits no edema.       Thoracic back: She exhibits no tenderness and no bony tenderness.       Lumbar back: She exhibits no tenderness and no bony  tenderness.       Back:  2-3 cm,no tender,very mobile nodular lesion palpated on left lower paraspinal area (L5-S1). ? lipoma  Lymphadenopathy:    She has no cervical adenopathy.  Neurological: [name], place, and time. She has normal strength. Coordination and gait normal.  Skin: Skin is warm. No rash noted. No erythema.  Psychiatric: Her mood appears anxious.  Well groomed, good eye contact.     ASSESSMENT AND PLAN:   Ms. Nakeesha was seen today for establish care.  Diagnoses and all orders for this visit:  Fatigue, unspecified type  We discussed possible etiologies: Systemic illness, immunologic,endocrinology,sleep disorder, psychiatric/psychologic, infectious,medications side effects, and idiopathic. Examination today does not suggest a serious process. Continue with healthy diet and regular physical activity.  Further recommendations will be given according to lab results.  -     Sedimentation rate -  C-reactive protein -     Hemoglobin A1c -     ANA,IFA RA Diag Pnl w/rflx Tit/Patn  Myalgia  ? Fibromyalgia. Further recommendations will be given according to lab results. For now she prefers to hold on pharmacologic treatment, we discussed a few options including Cymbalta, Labapentin, Lyrica.  -     CK -     ANA,IFA RA Diag Pnl w/rflx Tit/Patn  Bilateral back pain, unspecified back location, unspecified chronicity  He is most likely musculoskeletal but because no prior history plain imaging was ordered. Further recommendations will be given according to report.  -     DG Lumbar Spine Complete; Future  B12 deficiency  Today after verbal consent she received B12 1000 mcg IM. Recommend continuing weekly B12 1000 mcg IM for a month and then monthly.  -     cyanocobalamin ((VITAMIN B-12)) injection 1,000 mcg; Inject 1 mL (1,000 mcg total) into the muscle once.  Vitamin D deficiency, unspecified  Continue vitamin supplementation with  ergocalciferol 50,000 units weekly for 8 weeks then she can continue q 2 weeks. F/U in 3 months.    Maisen Schmit G. Martinique, MD  [facility]. [place] office.

## 2017-03-25 LAB — ANA,IFA RA DIAG PNL W/RFLX TIT/PATN
Anti Nuclear Antibody(ANA): NEGATIVE
Cyclic Citrullin Peptide Ab: <16 UNITS
Rhuematoid fact SerPl-aCnc: <14 IU/mL (ref <14)

## 2017-03-26 ENCOUNTER — Encounter: Payer: Self-pay | Admitting: Family Medicine

## 2017-03-27 DIAGNOSIS — J3081 Allergic rhinitis due to animal (cat) (dog) hair and dander: Secondary | ICD-10-CM | POA: Diagnosis not present

## 2017-03-27 DIAGNOSIS — J301 Allergic rhinitis due to pollen: Secondary | ICD-10-CM | POA: Diagnosis not present

## 2017-03-27 DIAGNOSIS — J3089 Other allergic rhinitis: Secondary | ICD-10-CM | POA: Diagnosis not present

## 2017-04-03 DIAGNOSIS — R5383 Other fatigue: Secondary | ICD-10-CM | POA: Diagnosis not present

## 2017-04-03 DIAGNOSIS — E538 Deficiency of other specified B group vitamins: Secondary | ICD-10-CM | POA: Diagnosis not present

## 2017-04-06 ENCOUNTER — Telehealth: Payer: Self-pay | Admitting: Family Medicine

## 2017-04-06 ENCOUNTER — Ambulatory Visit (INDEPENDENT_AMBULATORY_CARE_PROVIDER_SITE_OTHER): Payer: 59 | Admitting: *Deleted

## 2017-04-06 DIAGNOSIS — J3089 Other allergic rhinitis: Secondary | ICD-10-CM | POA: Diagnosis not present

## 2017-04-06 DIAGNOSIS — E538 Deficiency of other specified B group vitamins: Secondary | ICD-10-CM

## 2017-04-06 DIAGNOSIS — J3081 Allergic rhinitis due to animal (cat) (dog) hair and dander: Secondary | ICD-10-CM | POA: Diagnosis not present

## 2017-04-06 DIAGNOSIS — J301 Allergic rhinitis due to pollen: Secondary | ICD-10-CM | POA: Diagnosis not present

## 2017-04-06 MED ORDER — CYANOCOBALAMIN 1000 MCG/ML IJ SOLN
1000.0000 ug | Freq: Once | INTRAMUSCULAR | Status: AC
  Administered 2017-04-06: 1000 ug via INTRAMUSCULAR

## 2017-04-06 NOTE — Telephone Encounter (Signed)
She just needs a nurse visit appointment, the order is put in after she gets the injection. Per Dr. Doug Sou OV note, she recommends the B12 injection weekly for a month and then monthly.

## 2017-04-06 NOTE — Telephone Encounter (Signed)
Pt states Dr Martinique had reccommended weekly B 12 injections for a month and then monthly. Pt states she having the same issues associated with this need for the B12 and wants to start the B 12 injections asap Can you put the order in?

## 2017-04-06 NOTE — Telephone Encounter (Signed)
Left message to call back. Pt needs to be put on the nurse schedule.

## 2017-04-06 NOTE — Telephone Encounter (Signed)
Pt has been scheduled.  °

## 2017-04-13 DIAGNOSIS — E538 Deficiency of other specified B group vitamins: Secondary | ICD-10-CM | POA: Diagnosis not present

## 2017-04-20 ENCOUNTER — Ambulatory Visit (INDEPENDENT_AMBULATORY_CARE_PROVIDER_SITE_OTHER): Payer: 59

## 2017-04-20 DIAGNOSIS — E538 Deficiency of other specified B group vitamins: Secondary | ICD-10-CM | POA: Diagnosis not present

## 2017-04-20 DIAGNOSIS — J3089 Other allergic rhinitis: Secondary | ICD-10-CM | POA: Diagnosis not present

## 2017-04-20 DIAGNOSIS — J3081 Allergic rhinitis due to animal (cat) (dog) hair and dander: Secondary | ICD-10-CM | POA: Diagnosis not present

## 2017-04-20 DIAGNOSIS — J301 Allergic rhinitis due to pollen: Secondary | ICD-10-CM | POA: Diagnosis not present

## 2017-04-20 MED ORDER — CYANOCOBALAMIN 1000 MCG/ML IJ SOLN
1000.0000 ug | Freq: Once | INTRAMUSCULAR | Status: AC
  Administered 2017-04-20: 1000 ug via INTRAMUSCULAR

## 2017-04-27 ENCOUNTER — Ambulatory Visit (INDEPENDENT_AMBULATORY_CARE_PROVIDER_SITE_OTHER): Payer: 59

## 2017-04-27 DIAGNOSIS — J3089 Other allergic rhinitis: Secondary | ICD-10-CM | POA: Diagnosis not present

## 2017-04-27 DIAGNOSIS — J301 Allergic rhinitis due to pollen: Secondary | ICD-10-CM | POA: Diagnosis not present

## 2017-04-27 DIAGNOSIS — E538 Deficiency of other specified B group vitamins: Secondary | ICD-10-CM | POA: Diagnosis not present

## 2017-04-27 DIAGNOSIS — J3081 Allergic rhinitis due to animal (cat) (dog) hair and dander: Secondary | ICD-10-CM | POA: Diagnosis not present

## 2017-04-27 MED ORDER — CYANOCOBALAMIN 1000 MCG/ML IJ SOLN
1000.0000 ug | Freq: Once | INTRAMUSCULAR | Status: AC
  Administered 2017-04-27: 1000 ug via INTRAMUSCULAR

## 2017-05-04 ENCOUNTER — Ambulatory Visit (INDEPENDENT_AMBULATORY_CARE_PROVIDER_SITE_OTHER): Payer: 59 | Admitting: *Deleted

## 2017-05-04 DIAGNOSIS — E538 Deficiency of other specified B group vitamins: Secondary | ICD-10-CM

## 2017-05-04 DIAGNOSIS — J3081 Allergic rhinitis due to animal (cat) (dog) hair and dander: Secondary | ICD-10-CM | POA: Diagnosis not present

## 2017-05-04 DIAGNOSIS — J301 Allergic rhinitis due to pollen: Secondary | ICD-10-CM | POA: Diagnosis not present

## 2017-05-04 DIAGNOSIS — J3089 Other allergic rhinitis: Secondary | ICD-10-CM | POA: Diagnosis not present

## 2017-05-04 MED ORDER — CYANOCOBALAMIN 1000 MCG/ML IJ SOLN
1000.0000 ug | Freq: Once | INTRAMUSCULAR | Status: AC
  Administered 2017-05-04: 1000 ug via INTRAMUSCULAR

## 2017-05-04 NOTE — Progress Notes (Signed)
Per orders of Dr. Martinique, injection of B12 given by Dorrene German. Patient had weekly injection 4/4 last week but was still scheduled for this week. Per Dr. Martinique, okay to give another dose this week and then have patient start monthly injection next month. Repeat labs at next PCP appt in January. Patient tolerated injection well.  Dorrene German, RN

## 2017-05-11 DIAGNOSIS — J3089 Other allergic rhinitis: Secondary | ICD-10-CM | POA: Diagnosis not present

## 2017-05-11 DIAGNOSIS — J301 Allergic rhinitis due to pollen: Secondary | ICD-10-CM | POA: Diagnosis not present

## 2017-05-11 DIAGNOSIS — J4530 Mild persistent asthma, uncomplicated: Secondary | ICD-10-CM | POA: Diagnosis not present

## 2017-05-11 DIAGNOSIS — J3081 Allergic rhinitis due to animal (cat) (dog) hair and dander: Secondary | ICD-10-CM | POA: Diagnosis not present

## 2017-05-22 DIAGNOSIS — J301 Allergic rhinitis due to pollen: Secondary | ICD-10-CM | POA: Diagnosis not present

## 2017-05-22 DIAGNOSIS — J3081 Allergic rhinitis due to animal (cat) (dog) hair and dander: Secondary | ICD-10-CM | POA: Diagnosis not present

## 2017-05-22 DIAGNOSIS — J3089 Other allergic rhinitis: Secondary | ICD-10-CM | POA: Diagnosis not present

## 2017-05-28 ENCOUNTER — Encounter: Payer: Self-pay | Admitting: Obstetrics & Gynecology

## 2017-05-28 ENCOUNTER — Ambulatory Visit (INDEPENDENT_AMBULATORY_CARE_PROVIDER_SITE_OTHER): Payer: 59 | Admitting: Obstetrics & Gynecology

## 2017-05-28 VITALS — BP 114/78 | Ht 61.5 in | Wt 119.0 lb

## 2017-05-28 DIAGNOSIS — Z3009 Encounter for other general counseling and advice on contraception: Secondary | ICD-10-CM

## 2017-05-28 DIAGNOSIS — Z1151 Encounter for screening for human papillomavirus (HPV): Secondary | ICD-10-CM | POA: Diagnosis not present

## 2017-05-28 DIAGNOSIS — Z01419 Encounter for gynecological examination (general) (routine) without abnormal findings: Secondary | ICD-10-CM

## 2017-05-28 NOTE — Progress Notes (Signed)
Shelly Edwards [birth year] [id number]   History:    46 y.o. G2P2L2 Divorced.  Boyfriend.  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    RP:  Established patient presenting for annual gyn exam   HPI:  Menses regular normal every month.  No pelvic pain.  Normal vaginal secretions.  No contraception currently, would be interested in the IUD.  She has done well in the past on Mirena IUD.  Breasts normal.  Urine and bowel movements normal.  History of benign colon polyps in parents, and colon cancer in PGM.  MGM Breast Ca.  Patient's colonoscopy negative in 2016.  Restarted on regular fitness after treatment of low vitamin B12 with injections.  Past medical history,surgical history, family history and social history were all reviewed and documented in the EPIC chart.  Gynecologic History Patient's last menstrual period was 05/20/2017. Contraception: none Last Pap: 2015. Results were: normal Last mammogram: 2017. Results were: normal [place] 2016  Obstetric History OB History  Gravida Para Term Preterm AB Living  2 2 2     2   SAB TAB Ectopic Multiple Live Births          2    # Outcome Date GA Lbr Len/2nd Weight Sex Delivery Anes PTL Lv  2 Term     M Vag-Spont  N LIV  1 Term     M Vag-Spont  N LIV       ROS: A ROS was performed and pertinent positives and negatives are included in the history.  GENERAL: No fevers or chills. HEENT: No change in vision, no earache, sore throat or sinus congestion. NECK: No pain or stiffness. CARDIOVASCULAR: No chest pain or pressure. No palpitations. PULMONARY: No shortness of breath, cough or wheeze. GASTROINTESTINAL: No abdominal pain, nausea, vomiting or diarrhea, melena or bright red blood per rectum. GENITOURINARY: No urinary frequency, urgency, hesitancy or dysuria. MUSCULOSKELETAL: No joint or muscle pain, no back pain, no recent trauma. DERMATOLOGIC: No rash, no itching, no lesions. ENDOCRINE: No polyuria,  polydipsia, no heat or cold intolerance. No recent change in weight. HEMATOLOGICAL: No anemia or easy bruising or bleeding. NEUROLOGIC: No headache, seizures, numbness, tingling or weakness. PSYCHIATRIC: No depression, no loss of interest in normal activity or change in sleep pattern.     Exam:   BP 114/78   Ht 5' 1.5" (1.562 m)   Wt 119 lb (54 kg)   LMP 05/20/2017 Comment: no birth control   BMI 22.12 kg/m   Body mass index is 22.12 kg/m.  General appearance : Well developed well nourished female. No acute distress HEENT: Eyes: no retinal hemorrhage or exudates,  Neck supple, trachea midline, no carotid bruits, no thyroidmegaly Lungs: Clear to auscultation, no rhonchi or wheezes, or rib retractions  Heart: Regular rate and rhythm, no murmurs or gallops Breast:Examined in sitting and supine position were symmetrical in appearance, no palpable masses or tenderness,  no skin retraction, no nipple inversion, no nipple discharge, no skin discoloration, no axillary or supraclavicular lymphadenopathy Abdomen: no palpable masses or tenderness, no rebound or guarding Extremities: no edema or skin discoloration or tenderness  Pelvic: Vulva normal  Bartholin, Urethra, Skene Glands: Within normal limits             Vagina: No gross lesions or discharge  Cervix: No gross lesions or discharge.  Pap/HR HPV done  Uterus  AV, normal size, shape and consistency, non-tender and mobile  Adnexa  Without  masses or tenderness  Anus and perineum  normal    Assessment/Plan:  46 y.o. female for annual exam   1. Encounter for routine gynecological examination with Papanicolaou smear of cervix Normal gynecologic exam.  Pap test with high risk HPV done.  Breast exam normal status post bilateral augmentation.  Patient will schedule screening mammogram at the breast center now.  Colonoscopy normal in 2016.  Will repeat at 52.  Health labs with family physician.  2. Encounter for other general counseling or  advice on contraception Condoms recommended.  Contraception counseling done.  Different contraceptive option discussed including birth control pills, NuvaRing, Nexplanon, and IUD options: ParaGard IUD versus Mirena IUD.  Will probably follow up for Mirena IUD insertion.  Princess Bruins MD, 9:16 AM 05/28/2017

## 2017-05-28 NOTE — Addendum Note (Signed)
Addended by: Thurnell Garbe A on: 05/28/2017 10:24 AM   Modules accepted: Orders

## 2017-05-28 NOTE — Patient Instructions (Signed)
1. Encounter for routine gynecological examination with Papanicolaou smear of cervix Normal gynecologic exam.  Pap test with high risk HPV done.  Breast exam normal status post bilateral augmentation.  Patient will schedule screening mammogram at the breast center now.  Colonoscopy normal in 2016.  Will repeat at 75.  Health labs with family physician.  2. Encounter for other general counseling or advice on contraception Condoms recommended.  Contraception counseling done.  Different contraceptive option discussed including birth control pills, NuvaRing, Nexplanon, and IUD options: ParaGard IUD versus Mirena IUD.  Will probably follow up for Mirena IUD insertion.  Shelly Edwards, it was a pleasure meeting you today!  I will inform you of your results as soon as available.  Levonorgestrel intrauterine device (IUD) What is this medicine? LEVONORGESTREL IUD (LEE voe nor jes trel) is a contraceptive (birth control) device. The device is placed inside the uterus by a healthcare professional. It is used to prevent pregnancy. This device can also be used to treat heavy bleeding that occurs during your period. This medicine may be used for other purposes; ask your health care provider or pharmacist if you have questions. COMMON BRAND NAME(S): Minette Headland What should I tell my health care provider before I take this medicine? They need to know if you have any of these conditions: -abnormal Pap smear -cancer of the breast, uterus, or cervix -diabetes -endometritis -genital or pelvic infection now or in the past -have more than one sexual partner or your partner has more than one partner -heart disease -history of an ectopic or tubal pregnancy -immune system problems -IUD in place -liver disease or tumor -problems with blood clots or take blood-thinners -seizures -use intravenous drugs -uterus of unusual shape -vaginal bleeding that has not been explained -an unusual or allergic  reaction to levonorgestrel, other hormones, silicone, or polyethylene, medicines, foods, dyes, or preservatives -pregnant or trying to get pregnant -breast-feeding How should I use this medicine? This device is placed inside the uterus by a health care professional. Talk to your pediatrician regarding the use of this medicine in children. Special care may be needed. Overdosage: If you think you have taken too much of this medicine contact a poison control center or emergency room at once. NOTE: This medicine is only for you. Do not share this medicine with others. What if I miss a dose? This does not apply. Depending on the brand of device you have inserted, the device will need to be replaced every 3 to 5 years if you wish to continue using this type of birth control. What may interact with this medicine? Do not take this medicine with any of the following medications: -amprenavir -bosentan -fosamprenavir This medicine may also interact with the following medications: -aprepitant -armodafinil -barbiturate medicines for inducing sleep or treating seizures -bexarotene -boceprevir -griseofulvin -medicines to treat seizures like carbamazepine, ethotoin, felbamate, oxcarbazepine, phenytoin, topiramate -modafinil -pioglitazone -rifabutin -rifampin -rifapentine -some medicines to treat HIV infection like atazanavir, efavirenz, indinavir, lopinavir, nelfinavir, tipranavir, ritonavir -St. John's wort -warfarin This list may not describe all possible interactions. Give your health care provider a list of all the medicines, herbs, non-prescription drugs, or dietary supplements you use. Also tell them if you smoke, drink alcohol, or use illegal drugs. Some items may interact with your medicine. What should I watch for while using this medicine? Visit your doctor or health care professional for regular check ups. See your doctor if you or your partner has sexual contact with others, becomes HIV  positive,  or gets a sexual transmitted disease. This product does not protect you against HIV infection (AIDS) or other sexually transmitted diseases. You can check the placement of the IUD yourself by reaching up to the top of your vagina with clean fingers to feel the threads. Do not pull on the threads. It is a good habit to check placement after each menstrual period. Call your doctor right away if you feel more of the IUD than just the threads or if you cannot feel the threads at all. The IUD may come out by itself. You may become pregnant if the device comes out. If you notice that the IUD has come out use a backup birth control method like condoms and call your health care provider. Using tampons will not change the position of the IUD and are okay to use during your period. This IUD can be safely scanned with magnetic resonance imaging (MRI) only under specific conditions. Before you have an MRI, tell your healthcare provider that you have an IUD in place, and which type of IUD you have in place. What side effects may I notice from receiving this medicine? Side effects that you should report to your doctor or health care professional as soon as possible: -allergic reactions like skin rash, itching or hives, swelling of the face, lips, or tongue -fever, flu-like symptoms -genital sores -high blood pressure -no menstrual period for 6 weeks during use -pain, swelling, warmth in the leg -pelvic pain or tenderness -severe or sudden headache -signs of pregnancy -stomach cramping -sudden shortness of breath -trouble with balance, talking, or walking -unusual vaginal bleeding, discharge -yellowing of the eyes or skin Side effects that usually do not require medical attention (report to your doctor or health care professional if they continue or are bothersome): -acne -breast pain -change in sex drive or performance -changes in weight -cramping, dizziness, or faintness while the device is  being inserted -headache -irregular menstrual bleeding within first 3 to 6 months of use -nausea This list may not describe all possible side effects. Call your doctor for medical advice about side effects. You may report side effects to FDA at 1-800-FDA-1088. Where should I keep my medicine? This does not apply. NOTE: This sheet is a summary. It may not cover all possible information. If you have questions about this medicine, talk to your doctor, pharmacist, or health care provider.  2018 Elsevier/Gold Standard (2016-03-07 14:14:56)

## 2017-06-03 ENCOUNTER — Ambulatory Visit (INDEPENDENT_AMBULATORY_CARE_PROVIDER_SITE_OTHER): Payer: 59 | Admitting: *Deleted

## 2017-06-03 DIAGNOSIS — E538 Deficiency of other specified B group vitamins: Secondary | ICD-10-CM

## 2017-06-03 MED ORDER — CYANOCOBALAMIN 1000 MCG/ML IJ SOLN
1000.0000 ug | Freq: Once | INTRAMUSCULAR | Status: AC
  Administered 2017-06-03: 1000 ug via INTRAMUSCULAR

## 2017-06-04 LAB — PAP, TP IMAGING W/ HPV RNA, RFLX HPV TYPE 16,18/45: HPV DNA High Risk: DETECTED — AB

## 2017-06-12 DIAGNOSIS — J3089 Other allergic rhinitis: Secondary | ICD-10-CM | POA: Diagnosis not present

## 2017-06-12 DIAGNOSIS — J301 Allergic rhinitis due to pollen: Secondary | ICD-10-CM | POA: Diagnosis not present

## 2017-06-12 DIAGNOSIS — J3081 Allergic rhinitis due to animal (cat) (dog) hair and dander: Secondary | ICD-10-CM | POA: Diagnosis not present

## 2017-06-20 DIAGNOSIS — J040 Acute laryngitis: Secondary | ICD-10-CM | POA: Diagnosis not present

## 2017-06-20 DIAGNOSIS — J029 Acute pharyngitis, unspecified: Secondary | ICD-10-CM | POA: Diagnosis not present

## 2017-06-22 ENCOUNTER — Ambulatory Visit: Payer: 59 | Admitting: Family Medicine

## 2017-06-22 ENCOUNTER — Encounter: Payer: Self-pay | Admitting: Family Medicine

## 2017-06-22 VITALS — BP 118/76 | HR 75 | Temp 98.5°F | Ht 61.5 in | Wt 119.4 lb

## 2017-06-22 DIAGNOSIS — E559 Vitamin D deficiency, unspecified: Secondary | ICD-10-CM | POA: Diagnosis not present

## 2017-06-22 DIAGNOSIS — E538 Deficiency of other specified B group vitamins: Secondary | ICD-10-CM

## 2017-06-22 DIAGNOSIS — R740 Nonspecific elevation of levels of transaminase and lactic acid dehydrogenase [LDH]: Secondary | ICD-10-CM

## 2017-06-22 DIAGNOSIS — L659 Nonscarring hair loss, unspecified: Secondary | ICD-10-CM | POA: Diagnosis not present

## 2017-06-22 DIAGNOSIS — R5383 Other fatigue: Secondary | ICD-10-CM | POA: Diagnosis not present

## 2017-06-22 DIAGNOSIS — R7401 Elevation of levels of liver transaminase levels: Secondary | ICD-10-CM | POA: Insufficient documentation

## 2017-06-22 DIAGNOSIS — J3089 Other allergic rhinitis: Secondary | ICD-10-CM | POA: Diagnosis not present

## 2017-06-22 DIAGNOSIS — J3081 Allergic rhinitis due to animal (cat) (dog) hair and dander: Secondary | ICD-10-CM | POA: Diagnosis not present

## 2017-06-22 DIAGNOSIS — J301 Allergic rhinitis due to pollen: Secondary | ICD-10-CM | POA: Diagnosis not present

## 2017-06-22 DIAGNOSIS — R7989 Other specified abnormal findings of blood chemistry: Secondary | ICD-10-CM | POA: Insufficient documentation

## 2017-06-22 NOTE — Patient Instructions (Signed)
A few things to remember from today's visit:   Vitamin D deficiency, unspecified - Plan: Comprehensive metabolic panel, VITAMIN D 25 Hydroxy (Vit-D Deficiency, Fractures)  Elevated transaminase level - Plan: Comprehensive metabolic panel  Fatigue, unspecified type - Plan: Comprehensive metabolic panel, T4, free, T3, free, TSH, Cortisol, free, Serum  Nonscarring hair loss - Plan: Comprehensive metabolic panel, T4, free, T3, free, TSH  B12 deficiency - Plan: Vitamin B12  Changes today. Fasting labs tomorrow.   Please be sure medication list is accurate. If a new problem present, please set up appointment sooner than planned today.

## 2017-06-22 NOTE — Progress Notes (Signed)
HPI:   [name] is a 47 y.o. female, who is here today for 3 months follow up.   She was last seen on 03/23/17, when she was c/o generalized myalgias and fatigue.  Since her last OV she has been in [facility], Dx with viral URI. She still has mild dysphonia but most symptoms have resolved.   B12 deficiency: She is on B12 1000 mcg monthly.  Vitamin D deficiency: Currently she is on Ergocalciferol 50,000 units weekly.  Tolerating medications well,deneis side effects.  Fatigue has improved, it is less frequent but still having intermittent episodes.  Since her last visit she has had 2-3 episodes of severe fatigue. Myalgias have improved greatly. She has not identified exacerbating or alleviating factors.  She still having hair loss, she has no noted scalp lesions or alopecic areas. She would like her thyroid function check again. 02/2017 TSH was 0.9.   Abnormal liver function test: No history of alcohol abuse.  Denies abdominal pain, nausea, vomiting, changes in bowel habits, blood in stool or melena.    Review of Systems  Constitutional: Positive for fatigue. Negative for activity change, appetite change, fever and unexpected weight change.  HENT: Positive for voice change (Recent URI). Negative for mouth sores, nosebleeds, sore throat and trouble swallowing.   Eyes: Negative for redness and visual disturbance.  Respiratory: Negative for cough, shortness of breath and wheezing.   Cardiovascular: Negative for chest pain, palpitations and leg swelling.  Gastrointestinal: Negative for abdominal pain, nausea and vomiting.       Negative for changes in bowel habits.  Endocrine: Negative for cold intolerance and heat intolerance.  Genitourinary: Negative for decreased urine volume, dysuria and hematuria.  Musculoskeletal: Positive for myalgias. Negative for gait problem.  Skin: Negative for pallor and rash.  Allergic/Immunologic: Positive for  environmental allergies.  Neurological: Negative for syncope, weakness, numbness and headaches.  Hematological: Negative for adenopathy. Does not bruise/bleed easily.  Psychiatric/Behavioral: Negative for confusion. The patient is nervous/anxious.      Current Outpatient Medications on File Prior to Visit  Medication Sig Dispense Refill  . ADVAIR DISKUS 100-50 MCG/DOSE AEPB Inhale 1 puff into the lungs 2 (two) times daily.     . Calcium Carb-Cholecalciferol (CALCIUM 600+D3 PO) Take by mouth.    . Cholecalciferol (VITAMIN D PO) Take 1 tablet by mouth daily.    . clobetasol ointment (TEMOVATE) 7.59 % Apply 1 application topically 2 (two) times daily.    . cyanocobalamin (,VITAMIN B-12,) 1000 MCG/ML injection Inject 1,000 mcg into the muscle every 30 (thirty) days.    . diphenhydrAMINE (BENADRYL) 25 MG tablet Take 25 mg by mouth as needed.    . fluticasone (FLONASE) 50 MCG/ACT nasal spray Place into both nostrils daily.    Marland Kitchen loratadine (CLARITIN) 10 MG tablet Take 10 mg by mouth daily. Reported on 05/29/2015    . VENTOLIN HFA 108 (90 BASE) MCG/ACT inhaler Inhale 1-2 puffs into the lungs as needed. Reported on 05/29/2015     No current facility-administered medications on file prior to visit.      Past Medical History:  Diagnosis Date  . Allergic rhinitis   . Asthma    No Known Allergies  Social History   Socioeconomic History  . Marital status: Legally Separated    Spouse name: None  . Number of children: 2  . Years of education: None  . Highest education level: None  Social Needs  . Financial resource strain: None  .  Food insecurity - worry: None  . Food insecurity - inability: None  . Transportation needs - medical: None  . Transportation needs - non-medical: None  Occupational History  . 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[employer]  Tobacco Use  . Smoking status: Former Smoker    Last attempt to quit: 05/28/1987    Years since quitting: 30.0  .  Smokeless tobacco: Never Used  Substance and Sexual Activity  . Alcohol use: Yes    Alcohol/week: 0.0 oz    Comment: occ  . Drug use: No  . Sexual activity: Yes    Comment: patients husband with vasectomy  Other Topics Concern  . None  Social History Narrative   Married, 2 sons   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    Vitals:   06/22/17 1452  BP: 118/76  Pulse: 75  Temp: 98.5 F (36.9 C)  SpO2: 97%   Body mass index is 22.19 kg/m.   Physical Exam  Nursing note and vitals reviewed. Constitutional: She is oriented to person, place, and time. She appears well-developed and well-nourished. No distress.  HENT:  Head: Normocephalic and atraumatic.  Mouth/Throat: Oropharynx is clear and moist and mucous membranes are normal.  Eyes: Conjunctivae are normal. Pupils are equal, round, and reactive to light.  Neck: No tracheal deviation present. No thyroid mass and no thyromegaly present.  Cardiovascular: Normal rate and regular rhythm.  No murmur heard. Pulses:      Dorsalis pedis pulses are 2+ on the right side, and 2+ on the left side.  Respiratory: Effort normal and breath sounds normal. No respiratory distress.  GI: Soft. She exhibits no mass. There is no hepatomegaly. There is no tenderness.  Musculoskeletal: She exhibits no edema or tenderness.  Lymphadenopathy:    She has no cervical adenopathy.  Neurological: [name], place, and time. She has normal strength. Gait normal.  Skin: Skin is warm. No erythema.  Psychiatric: Her mood appears anxious.  Well groomed, good eye contact.     ASSESSMENT AND PLAN:   [name] was seen today for 3 months follow-up.  Diagnoses and all orders for this visit:   Lab Results  Component Value Date   TSH 1.05 06/23/2017   Lab Results  Component Value Date   ALT 16 06/23/2017   AST 33 06/23/2017   ALKPHOS 64 06/23/2017   BILITOT 0.7 06/23/2017   Lab Results    Component Value Date   CREATININE 0.64 06/23/2017   BUN 7 06/23/2017   NA 138 06/23/2017   K 4.0 06/23/2017   CL 101 06/23/2017   CO2 27 06/23/2017    Fatigue, unspecified type  Improved. Possible causes reviewed. Associated myalgias,which have also improved. ? Fibromyalgia. Continue with B12 and Vit D supplementation. Further recommendations will be given according to lab results.  -     Comprehensive metabolic panel; Future -     T4, free; Future -     T3, free; Future -     TSH; Future -     Cortisol, free, Serum; Future  Vitamin D deficiency, unspecified  No changes in current management, will follow labs done today and will give further recommendations accordingly.  -     Comprehensive metabolic panel; Future -     VITAMIN D 25 Hydroxy (Vit-D Deficiency, Fractures); Future  Elevated transaminase level  Further recommendations will be given according to lab results.  -  Comprehensive metabolic panel; Future  Nonscarring hair loss  Possible etiologies discussed. Hair pulling test negative. Dermatology evaluation can be considered.   -     Comprehensive metabolic panel; Future -     T4, free; Future -     T3, free; Future -     TSH; Future  B12 deficiency  No changes in current management. Will adjust B12 supplementation according to B12 results.   -     Vitamin B12; Future    -[name] was advised to return sooner than planned today if new concerns arise.       Betty G. Martinique, MD  [facility]. [place] office.

## 2017-06-23 ENCOUNTER — Other Ambulatory Visit (INDEPENDENT_AMBULATORY_CARE_PROVIDER_SITE_OTHER): Payer: 59

## 2017-06-23 DIAGNOSIS — E538 Deficiency of other specified B group vitamins: Secondary | ICD-10-CM | POA: Diagnosis not present

## 2017-06-23 DIAGNOSIS — R740 Nonspecific elevation of levels of transaminase and lactic acid dehydrogenase [LDH]: Secondary | ICD-10-CM

## 2017-06-23 DIAGNOSIS — R7401 Elevation of levels of liver transaminase levels: Secondary | ICD-10-CM

## 2017-06-23 DIAGNOSIS — R5383 Other fatigue: Secondary | ICD-10-CM

## 2017-06-23 DIAGNOSIS — L659 Nonscarring hair loss, unspecified: Secondary | ICD-10-CM | POA: Diagnosis not present

## 2017-06-23 DIAGNOSIS — E559 Vitamin D deficiency, unspecified: Secondary | ICD-10-CM

## 2017-06-23 DIAGNOSIS — J301 Allergic rhinitis due to pollen: Secondary | ICD-10-CM | POA: Diagnosis not present

## 2017-06-23 LAB — COMPREHENSIVE METABOLIC PANEL
ALT: 16 U/L (ref 0–35)
AST: 33 U/L (ref 0–37)
Albumin: 3.7 g/dL (ref 3.5–5.2)
Alkaline Phosphatase: 64 U/L (ref 39–117)
BUN: 7 mg/dL (ref 6–23)
CO2: 27 mEq/L (ref 19–32)
Calcium: 8.6 mg/dL (ref 8.4–10.5)
Chloride: 101 mEq/L (ref 96–112)
Creatinine, Ser: 0.64 mg/dL (ref 0.40–1.20)
GFR: 105.65 mL/min (ref >60.00)
Glucose, Bld: 83 mg/dL (ref 70–99)
Potassium: 4 mEq/L (ref 3.5–5.1)
Sodium: 138 mEq/L (ref 135–145)
Total Bilirubin: 0.7 mg/dL (ref 0.2–1.2)
Total Protein: 6.7 g/dL (ref 6.0–8.3)

## 2017-06-23 LAB — TSH: TSH: 1.05 u[IU]/mL (ref 0.35–4.50)

## 2017-06-23 LAB — VITAMIN B12: Vitamin B-12: 542 pg/mL (ref 211–911)

## 2017-06-23 LAB — VITAMIN D 25 HYDROXY (VIT D DEFICIENCY, FRACTURES): VITD: 42.37 ng/mL (ref 30.00–100.00)

## 2017-06-23 LAB — T3, FREE: T3, Free: 4 pg/mL (ref 2.3–4.2)

## 2017-06-23 LAB — T4, FREE: Free T4: 0.66 ng/dL (ref 0.60–1.60)

## 2017-06-24 ENCOUNTER — Encounter: Payer: Self-pay | Admitting: Family Medicine

## 2017-06-29 LAB — CORTISOL, FREE: Cortisol Free, Ser: 1 ug/dL — ABNORMAL HIGH

## 2017-07-01 ENCOUNTER — Encounter: Payer: Self-pay | Admitting: Family Medicine

## 2017-07-06 ENCOUNTER — Encounter: Payer: Self-pay | Admitting: Obstetrics & Gynecology

## 2017-07-06 ENCOUNTER — Ambulatory Visit: Payer: 59 | Admitting: Obstetrics & Gynecology

## 2017-07-06 VITALS — BP 128/76

## 2017-07-06 DIAGNOSIS — R87810 Cervical high risk human papillomavirus (HPV) DNA test positive: Secondary | ICD-10-CM | POA: Diagnosis not present

## 2017-07-06 DIAGNOSIS — R87612 Low grade squamous intraepithelial lesion on cytologic smear of cervix (LGSIL): Secondary | ICD-10-CM | POA: Diagnosis not present

## 2017-07-06 DIAGNOSIS — Z113 Encounter for screening for infections with a predominantly sexual mode of transmission: Secondary | ICD-10-CM | POA: Diagnosis not present

## 2017-07-06 DIAGNOSIS — N870 Mild cervical dysplasia: Secondary | ICD-10-CM | POA: Diagnosis not present

## 2017-07-06 NOTE — Progress Notes (Signed)
    Shelly Edwards 1971/02/18 [id number]        47 y.o.  [id number] Divorced.  Boyfriend.  RP:  LGSIL/HPV HR pos  HPI:  No pelvic pain.  No abnormal vaginal discharge.  No STD screen done at last visit.  Past medical history,surgical history, problem list, medications, allergies, family history and social history were all reviewed and documented in the EPIC chart.  Directed ROS with pertinent positives and negatives documented in the history of present illness/assessment and plan.  Exam:  Vitals:   07/06/17 1216  BP: 128/76   General appearance:  Normal Colposcopy Procedure Note Hollis Oh 07/06/2017  Indications:  Procedure Details  The risks and benefits of the procedure and Verbal informed consent obtained.  Speculum placed in vagina and excellent visualization of cervix achieved, cervix swabbed x 3 with acetic acid solution.  Findings:  Cervix colposcopy: Physical Exam  Genitourinary:      Vaginal colposcopy: Normal  Vulvar colposcopy: Grossly normal  Perirectal colposcopy: Grossly normal  Specimens: HPV 16-18-45, Gono-Chlam, Cervical biopsies at 3, 6, 9 O'clock.  Complications:  None, good hemostasis with Silver Nitrate . Plan:  Per HPV 16-18-45 and Cervical Bx results   Assessment/Plan:  47 y.o. G2P2002   1. LGSIL on Pap smear of cervix Colposcopy done today.  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  Pending cervical biopsies.  HPV 16, 18 and 45 done.  Management discussed with patient depending on results.  2. 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  3. Screen for STD (sexually transmitted disease) Recommend strict condom use. - Gono-Chlam  - HIV antibody (with reflex) - RPR - Hepatitis B Surface AntiGEN - Hepatitis C Antibody  Counseling on above issues more than 50% for 15 minutes.  Princess Bruins MD, 12:50 PM 07/06/2017

## 2017-07-06 NOTE — Patient Instructions (Signed)
1. LGSIL on Pap smear of cervix Colposcopy done today.  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  Pending cervical biopsies.  HPV 16, 18 and 45 done.  Management discussed with patient depending on results.  2. 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  3. Screen for STD (sexually transmitted disease) Recommend strict condom use. - Gono-Chlam  - HIV antibody (with reflex) - RPR - Hepatitis B Surface AntiGEN - Hepatitis C Antibody  Shelly Edwards, good seeing you today!  I wi inform you of all your results as soon as they are available.   Colposcopy, Care After This sheet gives you information about how to care for yourself after your procedure. Your health care provider may also give you more specific instructions. If you have problems or questions, contact your health care provider. What can I expect after the procedure? If you had a colposcopy without a biopsy, you can expect to feel fine right away, but you may have some spotting for a few days. You can go back to your normal activities. If you had a colposcopy with a biopsy, it is common to have:  Soreness and pain. This may last for a few days.  Light-headedness.  Mild vaginal bleeding or dark-colored, grainy discharge. This may last for a few days. The discharge may be due to a solution that was used during the procedure. You may need to wear a sanitary pad during this time.  Spotting for at least 48 hours after the procedure.  Follow these instructions at home:  Take over-the-counter and prescription medicines only as told by your health care provider. Talk with your health care provider about what type of over-the-counter pain medicine and prescription medicine you can start taking again. It is especially important to talk with your health care provider if you take blood-thinning medicine.  Do not drive or use heavy machinery while taking prescription pain medicine.  For at least 3 days after your  procedure, or as long as told by your health care provider, avoid: ? Douching. ? Using tampons. ? Having sexual intercourse.  Continue to use birth control (contraception).  Limit your physical activity for the first day after the procedure as told by your health care provider. Ask your health care provider what activities are safe for you.  It is up to you to get the results of your procedure. Ask your health care provider, or the department performing the procedure, when your results will be ready.  Keep all follow-up visits as told by your health care provider. This is important. Contact a health care provider if:  You develop a skin rash. Get help right away if:  You are bleeding heavily from your vagina or you are passing blood clots. This includes using more than one sanitary pad per hour for 2 hours in a row.  You have a fever or chills.  You have pelvic pain.  You have abnormal, yellow-colored, or bad-smelling vaginal discharge. This could be a sign of infection.  You have severe pain or cramps in your lower abdomen that do not get better with medicine.  You feel light-headed or dizzy, or you faint. Summary  If you had a colposcopy without a biopsy, you can expect to feel fine right away, but you may have some spotting for a few days. You can go back to your normal activities.  If you had a colposcopy with a biopsy, you may notice mild pain and spotting for 48 hours after  the procedure.  Avoid douching, using tampons, and having sexual intercourse for 3 days after the procedure or as long as told by your health care provider.  Contact your health care provider if you have bleeding, severe pain, or signs of infection. This information is not intended to replace advice given to you by your health care provider. Make sure you discuss any questions you have with your health care provider. Document Released: 03/16/2013 Document Revised: 01/11/2016 Document Reviewed:  01/11/2016 Elsevier Interactive Patient Education  2018 [employer].  Human Papillomavirus Human papillomavirus (HPV) is the most common sexually transmitted infection (STI). It easily spreads from person to person (is highly contagious). HPV infections cause genital warts. Certain types of HPV may cause cancers, including cancer of the lower part of the uterus (cervix), vagina, outer female genital area (vulva), penis, anus, and rectum. HPV may also cause cancers of the oral cavity, such as the throat, tongue, and tonsils. There are many types of HPV. It usually does not cause symptoms. However, sometimes there are wart-like lesions in the throat or warts in the genital area that you can see or feel. It is possible to be infected for long periods and pass HPV to others without knowing it. What are the causes? HPV is caused by a virus that spreads from person to person through sexual contact. This includes oral, vaginal, or anal sex. What increases the risk? The following factors may make you more likely to develop this condition:  Having unprotected oral, vaginal, or anal sex.  Having several sex partners.  Having a sex partner who has other sex partners.  Having or having had another STI.  Having a weak disease-fighting (immune) system.  Having damaged skin in the genital area.  What are the signs or symptoms? Most people who have HPV do not have any symptoms. If symptoms are present, they may include:  Wartlike lesions in the throat (from having oral sex).  Warts on the infected skin or mucous membranes.  Genital warts that may itch, burn, bleed, or be painful during sexual intercourse.  How is this diagnosed? If wartlike lesions are present in the throat or if genital warts are present, your health care provider can usually diagnose HPV with a physical exam. Genital warts are easily seen. In females, tests may be used to diagnose HPV, including:  A Pap test. A Pap test takes  a sample of cells from your cervix to check for cancer and HPV infection.  An HPV test. This is similar to a Pap test and involves taking a sample of cells from your cervix.  Using a scope to view the cervix (colposcopy). This may be done if a pelvic exam or Pap test is abnormal. A sample of tissue may be removed for testing (biopsy) during the colposcopy.  Currently, there is no test to detect HPV in males. How is this treated? There is no treatment for the virus itself. However, there are treatments for the health problems and symptoms HPV can cause. Your health care provider will monitor you closely after you are treated as HPV can come back and may need treatment again. Treatment for HPV may include:  Medicines, which may be injected or applied to genital warts in a cream, lotion, liquid or gel form.  Use of a probe to apply extreme cold (cryotherapy) to the genital warts.  Application of an intense beam of light (laser treatment) on the genital warts.  Use of a probe to apply extreme heat (  electrocautery) on the genital warts.  Surgery to remove the genital warts.  Follow these instructions at home: Medicines  Take over-the-counter and prescription medicines only as told by your health care provider. This include creams for itching or irritation.  Do not treat genital warts with medicines used for treating hand warts. General instructions  Do not touch or scratch the warts.  Do not have sex while you are being treated.  Do not douche or use tampons during treatment (women).  Tell your sex partner about your infection. He or she may also need to be treated.  If you become pregnant, tell your health care provider that you have HPV. Your health care provider will monitor you closely during pregnancy to make sure your baby is safe.  Keep all follow-up visits as told by your health care provider. This is important. How is this prevented?  Talk with your health care provider  about getting the HPV vaccines. These vaccines prevent some HPV infections and cancers. The vaccines are recommended for males and females between the ages of 47 and 5. They will not work if you already have HPV, and they are not recommended for pregnant women.  After treatment, use condoms during sex to prevent future infections.  Have only one sex partner.  Have a sex partner who does not have other sex partners.  Get regular Pap tests as directed by your health care provider. Contact a health care provider if:  The treated skin becomes red, swollen, or painful.  You have a fever.  You feel generally ill.  You feel lumps or pimples sticking out in and around your genital area.  You develop bleeding of the vagina or the treatment area.  You have painful sexual intercourse. Summary  Human papillomavirus (HPV) is the most common sexually transmitted infection (STI) and is highly contagious.  Most people carrying HPV do not have any symptoms.  HPV can be prevented with vaccination. The vaccine is recommended for males and females between the ages of 8 and 40.  There is no treatment for the virus itself. However, there are treatments for the health problems and symptoms HPV can cause. This information is not intended to replace advice given to you by your health care provider. Make sure you discuss any questions you have with your health care provider. Document Released: 08/16/2003 Document Revised: 05/04/2016 Document Reviewed: 05/04/2016 Elsevier Interactive Patient Education  [employer].

## 2017-07-06 NOTE — Addendum Note (Signed)
Addended by: Thurnell Garbe A on: 07/06/2017 02:25 PM   Modules accepted: Orders

## 2017-07-07 LAB — C. TRACHOMATIS/N. GONORRHOEAE RNA
C. trachomatis RNA, TMA: NOT DETECTED
N. gonorrhoeae RNA, TMA: NOT DETECTED

## 2017-07-07 LAB — HEPATITIS B SURFACE ANTIGEN: Hepatitis B Surface Ag: NONREACTIVE

## 2017-07-07 LAB — HEPATITIS C ANTIBODY
Hepatitis C Ab: NONREACTIVE
SIGNAL TO CUT-OFF: 0.01 (ref <1.00)

## 2017-07-07 LAB — RPR: RPR Ser Ql: NONREACTIVE

## 2017-07-07 LAB — HIV ANTIBODY (ROUTINE TESTING W REFLEX): HIV 1&2 Ab, 4th Generation: NONREACTIVE

## 2017-07-08 LAB — TISSUE PATH REPORT 10802

## 2017-07-08 LAB — PATHOLOGY

## 2017-07-09 LAB — HPV TYPE 16 AND 18/45 RNA
HPV Type 16 RNA: NOT DETECTED
HPV Type 18/45 RNA: NOT DETECTED

## 2017-08-25 NOTE — Progress Notes (Signed)
HPI:  Chief Complaint  Patient presents with  . Alopecia    ongoing issue    Ms.Zoiey Catrinia Racicot is a 47 y.o. female, who is here today complaining of hair loss, which has been going on for a while but she feels like it is getting worse.  She has had extensive workup due to fatigue, which has improved with B12 supplementation. She wonders if low B12 can be causing worsening of hair loss.  She has no noted alopecic areas but in general her hair is thinning out, she can see her scalp through her hair.  Also noticing thinning hair on legs and axillae.  She has not identified exacerbating or alleviating factors.  She has not used OTC treatment.  Lab Results  Component Value Date   [id number] 06/23/2017   On 02/27/2017 she had workup ordered by former PCP at [facility]: CMP, CBC, B12, 25 OH vitamin D.  Otherwise negative except for B12 of 82.  History of vitamin D deficiency, she completed treatment with Ergocalciferol 50,000 units weekly.  Currently she is on OTC vitamin D.  She has no noted a skin rash, fever, chills, night sweats, or abnormal weight loss. She is following a healthy diet and exercise regularly.  She denies high alcohol intake, she states that before she was drinking more alcohol than she should.  Lab Results  Component Value Date   TSH 1.05 06/23/2017   Lab Results  Component Value Date   ALT 16 06/23/2017   AST 33 06/23/2017   ALKPHOS 64 06/23/2017   BILITOT 0.7 06/23/2017   Lab Results  Component Value Date   CREATININE 0.64 06/23/2017   BUN 7 06/23/2017   NA 138 06/23/2017   K 4.0 06/23/2017   CL 101 06/23/2017   CO2 27 06/23/2017   Lab Results  Component Value Date   CRP 0.1 (L) 03/23/2017   Lab Results  Component Value Date   ANA NEGATIVE 03/23/2017    She has history of atopic dermatitis and has followed with dermatologist at [facility], she would like a provider here in [place].  Review of  Systems  Constitutional: Positive for fatigue. Negative for appetite change, chills and fever.  HENT: Negative for mouth sores, sore throat and voice change.   Eyes: Negative for discharge and redness.  Respiratory: Negative for cough, shortness of breath and wheezing.   Cardiovascular: Negative for chest pain, palpitations and leg swelling.  Gastrointestinal: Negative for abdominal pain, diarrhea, nausea and vomiting.  Endocrine: Negative for cold intolerance and heat intolerance.  Genitourinary: Negative for decreased urine volume and hematuria.  Musculoskeletal: Negative for gait problem, joint swelling and myalgias.  Skin: Negative for pallor and rash.  Allergic/Immunologic: Positive for environmental allergies.  Neurological: Negative for syncope, weakness, numbness and headaches.  Hematological: Negative for adenopathy. Does not bruise/bleed easily.  Psychiatric/Behavioral: Negative for confusion. The patient is nervous/anxious.       Current Outpatient Medications on File Prior to Visit  Medication Sig Dispense Refill  . ADVAIR DISKUS 100-50 MCG/DOSE AEPB Inhale 1 puff into the lungs 2 (two) times daily.     . Calcium Carb-Cholecalciferol (CALCIUM 600+D3 PO) Take by mouth.    . Cholecalciferol (VITAMIN D PO) Take 1 tablet by mouth daily.    . clobetasol ointment (TEMOVATE) 7.67 % Apply 1 application topically 2 (two) times daily.    . cyanocobalamin (,VITAMIN B-12,) 1000 MCG/ML injection Inject 1,000 mcg into the  muscle every 30 (thirty) days.    . diphenhydrAMINE (BENADRYL) 25 MG tablet Take 25 mg by mouth as needed.    . fluticasone (FLONASE) 50 MCG/ACT nasal spray Place into both nostrils daily.    Marland Kitchen loratadine (CLARITIN) 10 MG tablet Take 10 mg by mouth as needed. Reported on 05/29/2015    . VENTOLIN HFA 108 (90 BASE) MCG/ACT inhaler Inhale 1-2 puffs into the lungs as needed. Reported on 05/29/2015     No current facility-administered medications on file prior to visit.       Past Medical History:  Diagnosis Date  . Allergic rhinitis   . Asthma    No Known Allergies  Social History   Socioeconomic History  . Marital status: Legally Separated    Spouse name: None  . Number of children: 2  . Years of education: None  . Highest education level: None  Social Needs  . Financial resource strain: None  . Food insecurity - worry: None  . Food insecurity - inability: None  . Transportation needs - medical: None  . Transportation needs - non-medical: None  Occupational History  . 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[employer]  Tobacco Use  . Smoking status: Former Smoker    Last attempt to quit: 05/28/1987    Years since quitting: 30.2  . Smokeless tobacco: Never Used  Substance and Sexual Activity  . Alcohol use: Yes    Alcohol/week: 0.0 oz    Comment: occ  . Drug use: No  . Sexual activity: Yes    Comment: patients husband with vasectomy  Other Topics Concern  . 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   Physical Exam  Nursing note and vitals reviewed. Constitutional: She is oriented to person, place, and time. She appears well-developed and well-nourished. No distress.  HENT:  Head: Normocephalic.  Mouth/Throat: Oropharynx is clear and moist and mucous membranes are normal.  Eyes: Conjunctivae and EOM are normal. Pupils are equal, round, and reactive to light.  Cardiovascular: Normal rate and regular rhythm.  No murmur heard. Respiratory: Effort normal and breath sounds normal. No respiratory distress.  Musculoskeletal: She exhibits no edema or tenderness.  Lymphadenopathy:       Head (right side): No posterior auricular and no occipital adenopathy present.       Head (left side): No posterior auricular and no occipital  adenopathy present.    She has no cervical adenopathy.  Neurological: [name], place, and time. She has normal strength. No cranial nerve deficit. Gait normal.  Skin: Skin is warm. No rash noted. No erythema.  Hair pull test negative. I do not appreciate areas of alopecia or scalp lesions.  Psychiatric: Her mood appears anxious.  Well groomed, good eye contact.     ASSESSMENT AND PLAN:  Ms. Ayelet was seen today for alopecia.  Orders Placed This Encounter  Procedures  . Vitamin B12  . CBC  . Ferritin  . Ambulatory referral to Dermatology     B12 deficiency No changes in current management. Further recommendations will be given according to B12 results.  Atopic dermatitis Referral to dermatologist placed. No changes in current management.  Hair loss disorder  We discussed possible etiologies. So far workup has been negative. Some general recommendations that may help with hair loss were given. She agrees with dermatology evaluation. Further recommendations will be given according to lab results.       Antwaun Buth G. Martinique, MD  [facility]. [place] office.

## 2017-08-26 ENCOUNTER — Ambulatory Visit: Payer: 59 | Admitting: Family Medicine

## 2017-08-26 ENCOUNTER — Encounter: Payer: Self-pay | Admitting: Family Medicine

## 2017-08-26 VITALS — BP 120/80 | HR 87 | Temp 98.4°F | Resp 12 | Ht 61.5 in | Wt 121.2 lb

## 2017-08-26 DIAGNOSIS — L659 Nonscarring hair loss, unspecified: Secondary | ICD-10-CM | POA: Diagnosis not present

## 2017-08-26 DIAGNOSIS — L209 Atopic dermatitis, unspecified: Secondary | ICD-10-CM | POA: Diagnosis not present

## 2017-08-26 DIAGNOSIS — E538 Deficiency of other specified B group vitamins: Secondary | ICD-10-CM | POA: Diagnosis not present

## 2017-08-26 LAB — CBC
HCT: 39.2 % (ref 36.0–46.0)
Hemoglobin: 13.2 g/dL (ref 12.0–15.0)
MCHC: 33.7 g/dL (ref 30.0–36.0)
MCV: 96.4 fl (ref 78.0–100.0)
Platelets: 216 10*3/uL (ref 150.0–400.0)
RBC: 4.06 Mil/uL (ref 3.87–5.11)
RDW: 13.2 % (ref 11.5–15.5)
WBC: 5.4 10*3/uL (ref 4.0–10.5)

## 2017-08-26 LAB — VITAMIN B12: Vitamin B-12: 724 pg/mL (ref 211–911)

## 2017-08-26 LAB — FERRITIN: Ferritin: 33.4 ng/mL (ref 10.0–291.0)

## 2017-08-26 NOTE — Assessment & Plan Note (Signed)
No changes in current management. Further recommendations will be given according to B12 results.

## 2017-08-26 NOTE — Assessment & Plan Note (Signed)
Referral to dermatologist placed. No changes in current management.

## 2017-08-26 NOTE — Patient Instructions (Signed)
A few things to remember from today's visit:   B12 deficiency - Plan: Vitamin B12  Atopic dermatitis, unspecified type - Plan: Ambulatory referral to Dermatology  Hair loss disorder - Plan: Ambulatory referral to Dermatology, CBC, Ferritin   Please be sure medication list is accurate. If a new problem present, please set up appointment sooner than planned today.

## 2017-08-26 NOTE — Assessment & Plan Note (Addendum)
We discussed possible etiologies. So far workup has been negative. Some general recommendations that may help with hair loss were given. She agrees with dermatology evaluation. Further recommendations will be given according to lab results.

## 2017-08-30 ENCOUNTER — Encounter: Payer: Self-pay | Admitting: Family Medicine

## 2017-09-02 DIAGNOSIS — L209 Atopic dermatitis, unspecified: Secondary | ICD-10-CM | POA: Diagnosis not present

## 2017-09-02 DIAGNOSIS — L659 Nonscarring hair loss, unspecified: Secondary | ICD-10-CM | POA: Diagnosis not present

## 2017-09-24 DIAGNOSIS — S0502XA Injury of conjunctiva and corneal abrasion without foreign body, left eye, initial encounter: Secondary | ICD-10-CM | POA: Diagnosis not present

## 2017-10-02 DIAGNOSIS — L821 Other seborrheic keratosis: Secondary | ICD-10-CM | POA: Diagnosis not present

## 2017-10-02 DIAGNOSIS — L650 Telogen effluvium: Secondary | ICD-10-CM | POA: Diagnosis not present

## 2017-10-02 DIAGNOSIS — L209 Atopic dermatitis, unspecified: Secondary | ICD-10-CM | POA: Diagnosis not present

## 2017-10-27 ENCOUNTER — Ambulatory Visit: Payer: 59 | Admitting: Family Medicine

## 2017-10-27 ENCOUNTER — Encounter: Payer: Self-pay | Admitting: Family Medicine

## 2017-10-27 VITALS — BP 123/76 | HR 75 | Temp 98.0°F | Resp 12 | Ht 61.5 in | Wt 119.4 lb

## 2017-10-27 DIAGNOSIS — R58 Hemorrhage, not elsewhere classified: Secondary | ICD-10-CM

## 2017-10-27 DIAGNOSIS — F1020 Alcohol dependence, uncomplicated: Secondary | ICD-10-CM | POA: Insufficient documentation

## 2017-10-27 MED ORDER — THIAMINE HCL 100 MG PO TABS: 100.0000 mg | ORAL_TABLET | Freq: Every day | ORAL | 3 refills | Status: DC

## 2017-10-27 NOTE — Progress Notes (Signed)
HPI:   Shelly Edwards is a 47 y.o. female, who is here today concerned about alcohol intake. She was seen on 08/26/2017.  Since her last visit she follows with dermatology for hair loss, Rogaine OTC was recommended.    She feels like she is drinking "too much" and would like to have pharmacologic treatment. She has not looked into [employer]. Alcohol intake has increased in the past couple years.  FHx positive for alcohol abuse.  She lives alone, she is in a relationship. She still drinking beer first thing in the morning when she gets up and drinks more beer through the day, she can drink about 5-9 beers daily. She has tried to stop alcohol intake but has not been successful. Alcohol intake is not affecting  No illicit drug use. She does not feel like she has depression.  Concern about effects of alcohol on her liver.  She has had elevated AST, last liver function test was in normal range.   Also concerned about bruising with mild trauma, mainly on forearms. No nosebleeds, gum bleeding, blood in the stool, or gross hematuria.  Lab Results  Component Value Date   ALT 16 06/23/2017   AST 33 06/23/2017   ALKPHOS 64 06/23/2017   BILITOT 0.7 06/23/2017   Lab Results  Component Value Date   WBC 5.4 08/26/2017   HGB 13.2 08/26/2017   HCT 39.2 08/26/2017   MCV 96.4 08/26/2017   PLT 216.0 08/26/2017     Review of Systems  Constitutional: Positive for fatigue. Negative for diaphoresis and fever.  HENT: Negative for nosebleeds and sore throat.   Respiratory: Negative for shortness of breath and wheezing.   Cardiovascular: Negative for chest pain, palpitations and leg swelling.  Gastrointestinal: Negative for abdominal pain, nausea and vomiting.       No changes in bowel habits.  Genitourinary: Negative for decreased urine volume and hematuria.  Neurological: Negative for weakness and headaches.  Hematological: Bruises/bleeds easily.    Psychiatric/Behavioral: Negative for confusion. The patient is nervous/anxious.       Current Outpatient Medications on File Prior to Visit  Medication Sig Dispense Refill  . ADVAIR DISKUS 100-50 MCG/DOSE AEPB Inhale 1 puff into the lungs 2 (two) times daily.     . Calcium Carb-Cholecalciferol (CALCIUM 600+D3 PO) Take by mouth.    . Cholecalciferol (VITAMIN D PO) Take 1 tablet by mouth daily.    . clobetasol ointment (TEMOVATE) 6.64 % Apply 1 application topically 2 (two) times daily.    . diphenhydrAMINE (BENADRYL) 25 MG tablet Take 25 mg by mouth as needed.    . fluticasone (FLONASE) 50 MCG/ACT nasal spray Place into both nostrils daily.    Marland Kitchen loratadine (CLARITIN) 10 MG tablet Take 10 mg by mouth as needed. Reported on 05/29/2015    . VENTOLIN HFA 108 (90 BASE) MCG/ACT inhaler Inhale 1-2 puffs into the lungs as needed. Reported on 05/29/2015     No current facility-administered medications on file prior to visit.      Past Medical History:  Diagnosis Date  . Allergic rhinitis   . Asthma    Allergies  Allergen Reactions  . Molds & Smuts Itching and Shortness Of Breath    Social History   Socioeconomic History  . Marital status: Legally Separated    Spouse name: Not on file  . Number of children: 2  . Years of education: Not on file  . Highest education level: Not on file  Occupational History  . 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 Financial resource strain: Not on file  . Food insecurity:    Worry: Not on file    Inability: Not on file  . Transportation needs:    Medical: Not on file    Non-medical: Not on file  Tobacco Use  . Smoking status: Former Smoker    Last attempt to quit: 05/28/1987    Years since quitting: 30.4  . Smokeless tobacco: Never Used  Substance and Sexual Activity  . Alcohol use: Yes    Alcohol/week: 0.0 oz    Comment: occ  . Drug use: No  . Sexual activity: Yes    Comment: patients  husband with vasectomy  Lifestyle  . Physical activity:    Days per week: Not on file    Minutes per session: Not on file  . Stress: Not on file  Relationships  . Social connections:    Talks on phone: Not on file    Gets together: Not on file    Attends religious service: Not on file    Active member of club or organization: Not on file    Attends meetings of clubs or organizations: Not on file    Relationship status: Not on file  Other Topics Concern  . 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   Physical Exam  Nursing note and vitals reviewed. Constitutional: She is oriented to person, place, and time. She appears well-developed and well-nourished. No distress.  HENT:  Head: Normocephalic and atraumatic.  Eyes: Pupils are equal, round, and reactive to light. Conjunctivae are normal.  Cardiovascular: Normal rate and regular rhythm.  Pulses:      Dorsalis pedis pulses are 2+ on the right side, and 2+ on the left side.  Respiratory: Effort normal and breath sounds normal. No respiratory distress.  GI: Soft. She exhibits no mass. There is no hepatomegaly. There is no tenderness.  Musculoskeletal: She exhibits no edema.  Neurological: [name], place, and time. She has normal strength. Coordination normal.  Skin: Skin is warm. Ecchymosis noted. No rash noted. No erythema.  A couple of ecchymosis on forearms.  No tenderness or surrounding edema.  Psychiatric: Her mood appears anxious.  Well groomed, good eye contact.    ASSESSMENT AND PLAN:  Saron was seen today for follow-up.  Diagnoses and all orders for this visit:   Ecchymosis of forearm  Last CBC in 08/2017 in normal limits. Hx and examination today do not suggest serious process at this  time. We will monitor for now.  Alcohol use disorder, moderate, dependence ([place]) We discussed treatment options. Explained that I do not have much experience in this field, so I do not feel comfortable prescribing medication to help with alcohol intake. We discussed adverse effects of high alcohol intake. Thiamine 100 mg daily recommended. Strongly recommend psychotherapy, handout given with information, so she can arrange appointment.    25 min face to face OV. > 50% was dedicated to discussion of Dx, prognosis, treatment options, and coordination of care.    Betty G. Martinique, MD  [facility] [place] office.

## 2017-10-27 NOTE — Assessment & Plan Note (Addendum)
We discussed treatment options. Explained that I do not have much experience in this field, so I do not feel comfortable prescribing medication to help with alcohol intake. We discussed adverse effects of high alcohol intake. Thiamine 100 mg daily recommended. Strongly recommend psychotherapy, handout given with information, so she can arrange appointment.

## 2017-12-14 DIAGNOSIS — L659 Nonscarring hair loss, unspecified: Secondary | ICD-10-CM | POA: Diagnosis not present

## 2017-12-14 DIAGNOSIS — L209 Atopic dermatitis, unspecified: Secondary | ICD-10-CM | POA: Diagnosis not present

## 2018-02-02 DIAGNOSIS — N3001 Acute cystitis with hematuria: Secondary | ICD-10-CM | POA: Diagnosis not present

## 2018-02-25 ENCOUNTER — Encounter: Payer: Self-pay | Admitting: Family Medicine

## 2018-02-25 ENCOUNTER — Ambulatory Visit: Payer: 59 | Admitting: Family Medicine

## 2018-02-25 VITALS — BP 102/80 | HR 90 | Temp 98.4°F | Ht 61.5 in | Wt 112.9 lb

## 2018-02-25 DIAGNOSIS — R197 Diarrhea, unspecified: Secondary | ICD-10-CM | POA: Diagnosis not present

## 2018-02-25 DIAGNOSIS — R112 Nausea with vomiting, unspecified: Secondary | ICD-10-CM | POA: Diagnosis not present

## 2018-02-25 MED ORDER — ONDANSETRON 4 MG PO TBDP: 4.0000 mg | ORAL_TABLET | Freq: Three times a day (TID) | ORAL | 0 refills | Status: DC | PRN

## 2018-02-25 NOTE — Progress Notes (Signed)
HPI:  Using dictation device. Unfortunately this device frequently misinterprets words/phrases.  Acute visit for nausea and vomiting: -started 2 days -several episode nonbilious, nonbloody emesis and several loose stools -no fevers, and pain, dysuria, hematochezia, melena, body aches, recent travel  ROS: See pertinent positives and negatives per HPI.  Past Medical History:  Diagnosis Date  . Allergic rhinitis   . Asthma     Past Surgical History:  Procedure Laterality Date  . AUGMENTATION MAMMAPLASTY    . UMBILICAL HERNIA REPAIR  2008  . VAGINAL WOUND CLOSURE / REPAIR  4/10   RIGHT    Family History  Problem Relation Age of Onset  . Hypertension Mother   . Colon polyps Mother        pre-cancerous polyps  . Hypertension Father   . Heart disease Father   . Prostate cancer Father   . Colon polyps Father        pre-cancerous polyps  . Breast cancer Maternal Grandmother   . Colon cancer Paternal Grandmother 6  . Kidney disease Neg Hx   . Gallbladder disease Neg Hx   . Esophageal cancer Neg Hx     SOCIAL HX: see hpi   Current Outpatient Medications:  .  ADVAIR DISKUS 100-50 MCG/DOSE AEPB, Inhale 1 puff into the lungs 2 (two) times daily. , Disp: , Rfl:  .  Calcium Carb-Cholecalciferol (CALCIUM 600+D3 PO), Take by mouth., Disp: , Rfl:  .  Cholecalciferol (VITAMIN D PO), Take 1 tablet by mouth daily., Disp: , Rfl:  .  clobetasol ointment (TEMOVATE) 1.47 %, Apply 1 application topically 2 (two) times daily., Disp: , Rfl:  .  diphenhydrAMINE (BENADRYL) 25 MG tablet, Take 25 mg by mouth as needed., Disp: , Rfl:  .  fluticasone (FLONASE) 50 MCG/ACT nasal spray, Place into both nostrils daily., Disp: , Rfl:  .  loratadine (CLARITIN) 10 MG tablet, Take 10 mg by mouth as needed. Reported on 05/29/2015, Disp: , Rfl:  .  thiamine 100 MG tablet, Take 1 tablet (100 mg total) by mouth daily., Disp: 90 tablet, Rfl: 3 .  VENTOLIN HFA 108 (90 BASE) MCG/ACT inhaler, Inhale 1-2 puffs  into the lungs as needed. Reported on 05/29/2015, Disp: , Rfl:  .  ondansetron (ZOFRAN ODT) 4 MG disintegrating tablet, Take 1 tablet (4 mg total) by mouth every 8 (eight) hours as needed for nausea or vomiting., Disp: 20 tablet, Rfl: 0  EXAM:  Vitals:   02/25/18 1128  BP: 102/80  Pulse: 90  Temp: 98.4 F (36.9 C)  SpO2: 99%    Body mass index is 20.99 kg/m.  GENERAL: vitals reviewed and listed above, alert, oriented, appears well hydrated and in no acute distress  HEENT: atraumatic, conjunttiva clear, no obvious abnormalities on inspection of external nose and ears  NECK: no obvious masses on inspection  LUNGS: clear to auscultation bilaterally, no wheezes, rales or rhonchi, good air movement  CV: HRRR, no peripheral edema  ABD: BS+, soft, NTTP  MS: moves all extremities without noticeable abnormality  PSYCH: pleasant and cooperative, no obvious depression or anxiety  ASSESSMENT AND PLAN:  Discussed the following assessment and plan:  Nausea and vomiting, intractability of vomiting not specified, unspecified vomiting type  Diarrhea, unspecified type  -we discussed possible serious and likely etiologies, workup and treatment, treatment risks and return precautions - suspect gastroenteritis most likely -after this discussion, Makyra opted for zofran prn, imodium prn, oral rehydration -of course, we advised Maysie  to return or notify a doctor  immediately if symptoms worsen or persist or new concerns arise. -declined AVS, agreed to seek care if worsening or not improving as expected or not able to tol adequate oral hydration  There are no Patient Instructions on file for this visit.  Lucretia Kern, DO

## 2018-04-20 DIAGNOSIS — J3089 Other allergic rhinitis: Secondary | ICD-10-CM | POA: Diagnosis not present

## 2018-04-20 DIAGNOSIS — J4530 Mild persistent asthma, uncomplicated: Secondary | ICD-10-CM | POA: Diagnosis not present

## 2018-04-20 DIAGNOSIS — J301 Allergic rhinitis due to pollen: Secondary | ICD-10-CM | POA: Diagnosis not present

## 2018-05-10 DIAGNOSIS — J018 Other acute sinusitis: Secondary | ICD-10-CM | POA: Diagnosis not present

## 2018-05-27 ENCOUNTER — Encounter: Payer: Self-pay | Admitting: Obstetrics & Gynecology

## 2018-05-27 ENCOUNTER — Ambulatory Visit (INDEPENDENT_AMBULATORY_CARE_PROVIDER_SITE_OTHER): Payer: 59 | Admitting: Obstetrics & Gynecology

## 2018-05-27 VITALS — BP 122/80 | Ht 61.5 in | Wt 112.8 lb

## 2018-05-27 DIAGNOSIS — Z01419 Encounter for gynecological examination (general) (routine) without abnormal findings: Secondary | ICD-10-CM | POA: Diagnosis not present

## 2018-05-27 DIAGNOSIS — N870 Mild cervical dysplasia: Secondary | ICD-10-CM

## 2018-05-27 DIAGNOSIS — Z3009 Encounter for other general counseling and advice on contraception: Secondary | ICD-10-CM

## 2018-05-27 DIAGNOSIS — Z1151 Encounter for screening for human papillomavirus (HPV): Secondary | ICD-10-CM | POA: Diagnosis not present

## 2018-05-27 NOTE — Progress Notes (Signed)
Shelly Edwards [birth year] [id number]   History:    47 y.o. G2P2L2 Divorced.  Stable boyfriend.  Sons 63 and 20 yo.  RP:  Established patient presenting for annual gyn exam   HPI: Menstrual periods normal and regular every month.  No breakthrough bleeding.  No pelvic pain.  Sexually active with no pain with intercourse.  Not using contraception.  Last menstrual period normal May 09, 2018.  Breasts normal.  Body mass index 20.97.  Exercising regularly.  Fasting for health labs here today.  Family history of colon cancer.  Past medical history,surgical history, family history and social history were all reviewed and documented in the EPIC chart.  Gynecologic History Patient's last menstrual period was 05/09/2018. Contraception: none Last Pap: 05/2017. Results were: LGSIL/HPV HR pos.  Colpo 06/2017 CIN 1, HPV 16-18-45 negative. Last mammogram: 04/2016. Results were: Negative Bone Density: Never Colonoscopy: 2016  Obstetric History OB History  Gravida Para Term Preterm AB Living  2 2 2     2   SAB TAB Ectopic Multiple Live Births          2    # Outcome Date GA Lbr Len/2nd Weight Sex Delivery Anes PTL Lv  2 Term     M Vag-Spont  N LIV  1 Term     M Vag-Spont  N LIV     ROS: A ROS was performed and pertinent positives and negatives are included in the history.  GENERAL: No fevers or chills. HEENT: No change in vision, no earache, sore throat or sinus congestion. NECK: No pain or stiffness. CARDIOVASCULAR: No chest pain or pressure. No palpitations. PULMONARY: No shortness of breath, cough or wheeze. GASTROINTESTINAL: No abdominal pain, nausea, vomiting or diarrhea, melena or bright red blood per rectum. GENITOURINARY: No urinary frequency, urgency, hesitancy or dysuria. MUSCULOSKELETAL: No joint or muscle pain, no back pain, no recent trauma. DERMATOLOGIC: No rash, no itching, no lesions. ENDOCRINE: No polyuria, polydipsia, no heat or cold intolerance. No recent change  in weight. HEMATOLOGICAL: No anemia or easy bruising or bleeding. NEUROLOGIC: No headache, seizures, numbness, tingling or weakness. PSYCHIATRIC: No depression, no loss of interest in normal activity or change in sleep pattern.     Exam:   BP 122/80   Ht 5' 1.5" (1.562 m)   Wt 112 lb 12.8 oz (51.2 kg)   LMP 05/09/2018   BMI 20.97 kg/m   Body mass index is 20.97 kg/m.  General appearance : Well developed well nourished female. No acute distress HEENT: Eyes: no retinal hemorrhage or exudates,  Neck supple, trachea midline, no carotid bruits, no thyroidmegaly Lungs: Clear to auscultation, no rhonchi or wheezes, or rib retractions  Heart: Regular rate and rhythm, no murmurs or gallops Breast:Examined in sitting and supine position were symmetrical in appearance, no palpable masses or tenderness,  no skin retraction, no nipple inversion, no nipple discharge, no skin discoloration, no axillary or supraclavicular lymphadenopathy Abdomen: no palpable masses or tenderness, no rebound or guarding Extremities: no edema or skin discoloration or tenderness  Pelvic: Vulva: Normal             Vagina: No gross lesions or discharge  Cervix: No gross lesions or discharge.  Pap/HPV HR done.  Uterus  AV, normal size, shape and consistency, non-tender and mobile  Adnexa  Without masses or tenderness  Anus: Normal   Assessment/Plan:  47 y.o. female for annual exam   1. Encounter for routine gynecological examination with Papanicolaou smear of cervix  Normal gynecologic exam.  Pap with high-risk HPV done today.  Breast exam normal.  Will schedule screening mammogram now at the breast center.  Fasting health labs here today.  Good body mass index at 20.97.  Continue with regular physical activity, aerobic activities 5 times a week and weightlifting every 2 days. - CBC - Comp Met (CMET) - TSH - Lipid panel - VITAMIN D 25 Hydroxy (Vit-D Deficiency, Fractures)  2. Encounter for other general counseling  or advice on contraception Strongly recommended to use strict contraception.  Progesterone IUD and copper IUD discussed.  Patient not ready to make a decision today.  Strict condom use with spermicide recommended.  Will call back if decides on more secure contraception.  3. Dysplasia of cervix, low grade (CIN 1) Colposcopy January 2019 showed CIN-1.  HPV 16-18-45 were negative.  Pap test with high risk HPV repeated today.  Management per results.  Princess Bruins MD, 9:26 AM 05/27/2018

## 2018-05-27 NOTE — Patient Instructions (Signed)
1. Encounter for routine gynecological examination with Papanicolaou smear of cervix Normal gynecologic exam.  Pap with high-risk HPV done today.  Breast exam normal.  Will schedule screening mammogram now at the breast center.  Fasting health labs here today.  Good body mass index at 20.97.  Continue with regular physical activity, aerobic activities 5 times a week and weightlifting every 2 days. - CBC - Comp Met (CMET) - TSH - Lipid panel - VITAMIN D 25 Hydroxy (Vit-D Deficiency, Fractures)  2. Encounter for other general counseling or advice on contraception Strongly recommended to use strict contraception.  Progesterone IUD and copper IUD discussed.  Patient not ready to make a decision today.  Strict condom use with spermicide recommended.  Will call back if decides on more secure contraception.  3. Dysplasia of cervix, low grade (CIN 1) Colposcopy January 2019 showed CIN-1.  HPV 16-18-45 were negative.  Pap test with high risk HPV repeated today.  Management per results.  Blondell, it was a pleasure seeing you today!  I will inform you of your results as soon as they are available.

## 2018-05-28 ENCOUNTER — Other Ambulatory Visit: Payer: Self-pay | Admitting: Family Medicine

## 2018-05-28 LAB — CBC
HCT: 39.5 % (ref 35.0–45.0)
Hemoglobin: 13.8 g/dL (ref 11.7–15.5)
MCH: 32.7 pg (ref 27.0–33.0)
MCHC: 34.9 g/dL (ref 32.0–36.0)
MCV: 93.6 fL (ref 80.0–100.0)
MPV: 11 fL (ref 7.5–12.5)
Platelets: 253 10*3/uL (ref 140–400)
RBC: 4.22 10*6/uL (ref 3.80–5.10)
RDW: 12.9 % (ref 11.0–15.0)
WBC: 8.3 10*3/uL (ref 3.8–10.8)

## 2018-05-28 LAB — COMPREHENSIVE METABOLIC PANEL
AG Ratio: 1.4 (calc) (ref 1.0–2.5)
ALT: 13 U/L (ref 6–29)
AST: 26 U/L (ref 10–35)
Albumin: 3.6 g/dL (ref 3.6–5.1)
Alkaline phosphatase (APISO): 56 U/L (ref 33–115)
BUN: 11 mg/dL (ref 7–25)
CO2: 22 mmol/L (ref 20–32)
Calcium: 9 mg/dL (ref 8.6–10.2)
Chloride: 100 mmol/L (ref 98–110)
Creat: 0.95 mg/dL (ref 0.50–1.10)
Globulin: 2.6 g/dL (calc) (ref 1.9–3.7)
Glucose, Bld: 95 mg/dL (ref 65–99)
Potassium: 3.7 mmol/L (ref 3.5–5.3)
Sodium: 134 mmol/L — ABNORMAL LOW (ref 135–146)
Total Bilirubin: 0.7 mg/dL (ref 0.2–1.2)
Total Protein: 6.2 g/dL (ref 6.1–8.1)

## 2018-05-28 LAB — TSH: TSH: 1.34 mIU/L

## 2018-05-28 LAB — LIPID PANEL
Cholesterol: 174 mg/dL (ref <200)
HDL: 73 mg/dL (ref >50)
LDL Cholesterol (Calc): 54 mg/dL (calc)
Non-HDL Cholesterol (Calc): 101 mg/dL (calc) (ref <130)
Total CHOL/HDL Ratio: 2.4 (calc) (ref <5.0)
Triglycerides: 386 mg/dL — ABNORMAL HIGH (ref <150)

## 2018-05-28 LAB — VITAMIN D 25 HYDROXY (VIT D DEFICIENCY, FRACTURES): Vit D, 25-Hydroxy: 23 ng/mL — ABNORMAL LOW (ref 30–100)

## 2018-05-28 NOTE — Telephone Encounter (Signed)
Requested medication (s) are due for refill today: yes  Requested medication (s) are on the active medication list: yes  Last refill:  02/25/18  Future visit scheduled: no  Notes to clinic:  NT not allowed to fill   Requested Prescriptions  Pending Prescriptions Disp Refills   ondansetron (ZOFRAN ODT) 4 MG disintegrating tablet 20 tablet 0    Sig: Take 1 tablet (4 mg total) by mouth every 8 (eight) hours as needed for nausea or vomiting.     Not Delegated - Gastroenterology: Antiemetics Failed - 05/28/2018  3:45 PM      Failed - This refill cannot be delegated      Passed - Valid encounter within last 6 months    Recent Outpatient Visits          3 months ago Nausea and vomiting, intractability of vomiting not specified, unspecified vomiting type   Therapist, music at [employer], [place], DO   7 months ago Alcohol use disorder, moderate, dependence ([place])   Therapist, music at [facility], Malka So, MD   9 months ago B12 deficiency   Therapist, music at [facility], Malka So, MD   11 months ago Fatigue, unspecified type   Therapist, music at [facility] [place], Malka So, MD   1 year ago Fatigue, unspecified type   Therapist, music at [facility], Malka So, MD

## 2018-05-28 NOTE — Telephone Encounter (Signed)
Message sent to Dr. Jordan for review and approval. 

## 2018-05-28 NOTE — Telephone Encounter (Signed)
I have not prescribed this medication, it seem like it was prescribed in 02/2018. Also this medication is not for chronic use.  Thanks, BJ

## 2018-05-28 NOTE — Telephone Encounter (Signed)
Copied from [place]. Topic: Quick Communication - Rx Refill/Question >> May 28, 2018  3:37 PM Rayann Heman wrote: Medication: ondansetron (ZOFRAN ODT) 4 MG disintegrating tablet [[id number]]   Has the patient contacted their pharmacy? yes Preferred Pharmacy (with phone number or street name):[id number] - [place], [place] [phone] (Phone) [phone] (Fax)   Agent: Please be advised that RX refills may take up to 3 business days. We ask that you follow-up with your pharmacy.

## 2018-05-31 ENCOUNTER — Other Ambulatory Visit: Payer: Self-pay | Admitting: Obstetrics & Gynecology

## 2018-05-31 DIAGNOSIS — E559 Vitamin D deficiency, unspecified: Secondary | ICD-10-CM

## 2018-05-31 LAB — PAP, TP IMAGING W/ HPV RNA, RFLX HPV TYPE 16,18/45: HPV DNA High Risk: NOT DETECTED

## 2018-05-31 MED ORDER — VITAMIN D (ERGOCALCIFEROL) 1.25 MG (50000 UNIT) PO CAPS: 50000.0000 [IU] | ORAL_CAPSULE | ORAL | 0 refills | Status: DC

## 2018-05-31 NOTE — Telephone Encounter (Signed)
Noted  

## 2018-06-03 DIAGNOSIS — J4520 Mild intermittent asthma, uncomplicated: Secondary | ICD-10-CM | POA: Diagnosis not present

## 2018-06-03 DIAGNOSIS — R05 Cough: Secondary | ICD-10-CM | POA: Diagnosis not present

## 2018-06-11 ENCOUNTER — Ambulatory Visit: Payer: 59 | Admitting: Family Medicine

## 2018-06-11 ENCOUNTER — Encounter: Payer: Self-pay | Admitting: Family Medicine

## 2018-06-11 VITALS — BP 116/80 | HR 84 | Temp 98.2°F | Wt 113.4 lb

## 2018-06-11 DIAGNOSIS — J209 Acute bronchitis, unspecified: Secondary | ICD-10-CM | POA: Diagnosis not present

## 2018-06-11 MED ORDER — BENZONATATE 200 MG PO CAPS: 200.0000 mg | ORAL_CAPSULE | Freq: Two times a day (BID) | ORAL | 1 refills | Status: DC | PRN

## 2018-06-11 MED ORDER — AZITHROMYCIN 250 MG PO TABS: ORAL_TABLET | ORAL | 0 refills | Status: DC

## 2018-06-11 NOTE — Progress Notes (Signed)
   Subjective:    Patient ID: Di Kindle, female    DOB: [birth year], 48 y.o.   MRN: [id number]  HPI Here for 2 weeks of chest tightness, burning in the chest , and a dry cough. No fever. No sinus drainage. She went to a [facility] a week ago and was given a Prednisone taper, Benzonatate, and Mucinex. This helped a little but the cough persists.    Review of Systems  Constitutional: Negative.   HENT: Positive for congestion and postnasal drip. Negative for sinus pressure, sinus pain and sore throat.   Eyes: Negative.   Respiratory: Positive for cough and chest tightness. Negative for shortness of breath and wheezing.        Objective:   Physical Exam Constitutional:      Appearance: Normal appearance.  HENT:     Right Ear: Tympanic membrane and ear canal normal.     Left Ear: Tympanic membrane and ear canal normal.     Nose: Nose normal.     Mouth/Throat:     Pharynx: Posterior oropharyngeal erythema present. No oropharyngeal exudate.  Eyes:     Conjunctiva/sclera: Conjunctivae normal.  Neck:     Musculoskeletal: Normal range of motion.  Pulmonary:     Effort: Pulmonary effort is normal. No respiratory distress.     Breath sounds: No wheezing or rales.     Comments: Scattered rhonchi  Lymphadenopathy:     Cervical: No cervical adenopathy.  Neurological:     Mental Status: She is alert.           Assessment & Plan:  Bronchitis, treat with a Zpack. Add Delsym prn.  Alysia Penna, MD

## 2018-06-21 ENCOUNTER — Other Ambulatory Visit: Payer: Self-pay | Admitting: Obstetrics & Gynecology

## 2018-06-21 DIAGNOSIS — Z1231 Encounter for screening mammogram for malignant neoplasm of breast: Secondary | ICD-10-CM

## 2018-07-21 ENCOUNTER — Ambulatory Visit
Admission: RE | Admit: 2018-07-21 | Discharge: 2018-07-21 | Disposition: A | Discharge status: Home / Self Care | Payer: 59 | Source: Ambulatory Visit | Attending: Obstetrics & Gynecology | Admitting: Obstetrics & Gynecology

## 2018-07-21 DIAGNOSIS — Z1231 Encounter for screening mammogram for malignant neoplasm of breast: Secondary | ICD-10-CM

## 2018-08-23 ENCOUNTER — Ambulatory Visit: Payer: Self-pay

## 2018-08-23 NOTE — Telephone Encounter (Signed)
Attempted to contact patient. Left VM to return call to office. 

## 2018-08-23 NOTE — Telephone Encounter (Signed)
Pt reports nausea, onset yesterday. Small amount of emesis this AM, one episode, "But I haven't eaten anything." States 1 loose stool this AM. Denies fever, abdominal pain. States is staying hydrated. Reports similar episode with nausea several months ago "Stomach virus"  and had Zofran called in for her at that time. Requesting Zofran called in to pharmacy instead of coming in for appt. Home care advise given, pt verbalizes understanding.  If appropriate: [place].  Reason for Disposition . Unexplained nausea  Answer Assessment - Initial Assessment Questions 1. NAUSEA SEVERITY: "How bad is the nausea?" (e.g., mild, moderate, severe; dehydration, weight loss)   - MILD: loss of appetite without change in eating habits   - MODERATE: decreased oral intake without significant weight loss, dehydration, or malnutrition   - SEVERE: inadequate caloric or fluid intake, significant weight loss, symptoms of dehydration    moderate 2. ONSET: "When did the nausea begin?"     Mild, yesterday 3. VOMITING: "Any vomiting?" If so, ask: "How many times today?"     1 time this AM, has not eaten 4. RECURRENT SYMPTOM: "Have you had nausea before?" If so, ask: "When was the last time?" "What happened that time?"     no 5. CAUSE: "What do you think is causing the nausea?"     Several months ago had Zofran which helped 6. PREGNANCY: "Is there any chance you are pregnant?" (e.g., unprotected intercourse, missed birth control pill, broken condom)     No  Protocols used: NAUSEA-A-AH

## 2018-08-24 ENCOUNTER — Other Ambulatory Visit: Payer: Self-pay | Admitting: Family Medicine

## 2018-08-24 MED ORDER — ONDANSETRON 4 MG PO TBDP: 4.0000 mg | ORAL_TABLET | Freq: Three times a day (TID) | ORAL | 0 refills | Status: AC | PRN

## 2018-08-24 NOTE — Telephone Encounter (Signed)
Zofran 4 mg sent to her pharmacy to take 3 times per day as needed for nausea and/or vomiting. If still having symptoms in 2 to 3 days, she needs to be evaluated. Thanks, BJ

## 2018-08-24 NOTE — Telephone Encounter (Signed)
Patient informed via voicemail.

## 2018-08-24 NOTE — Telephone Encounter (Signed)
Message sent to Dr. Jordan for review. 

## 2019-02-25 ENCOUNTER — Other Ambulatory Visit: Payer: Self-pay

## 2019-02-25 ENCOUNTER — Telehealth (INDEPENDENT_AMBULATORY_CARE_PROVIDER_SITE_OTHER): Payer: 59 | Admitting: Family Medicine

## 2019-02-25 DIAGNOSIS — R112 Nausea with vomiting, unspecified: Secondary | ICD-10-CM

## 2019-02-25 DIAGNOSIS — G43009 Migraine without aura, not intractable, without status migrainosus: Secondary | ICD-10-CM

## 2019-02-25 DIAGNOSIS — R197 Diarrhea, unspecified: Secondary | ICD-10-CM

## 2019-02-25 MED ORDER — SUMATRIPTAN SUCCINATE 50 MG PO TABS: 50.0000 mg | ORAL_TABLET | Freq: Every day | ORAL | 1 refills | Status: DC | PRN

## 2019-02-25 MED ORDER — ONDANSETRON 4 MG PO TBDP: 4.0000 mg | ORAL_TABLET | Freq: Three times a day (TID) | ORAL | 0 refills | Status: DC | PRN

## 2019-02-25 NOTE — Progress Notes (Signed)
Virtual Visit via Video Note   I connected with Shelly Edwards on 02/25/19 by a video enabled telemedicine application and verified that I am speaking with the correct person using two identifiers.  Location patient: home Location provider:work office Persons participating in the virtual visit: patient, provider  I discussed the limitations of evaluation and management by telemedicine and the availability of in person appointments. The patient expressed understanding and agreed to proceed.   HPI: Shelly Edwards is a 48 yo female with hx of high alcohol intake,anxiety, and chronic fatigue c/o diarrhea ,nasuea,and vomiting.  She has had intermittent episodes of loose/softer stool for years, it seems to be getting worse for the past few months and definitely for the past couple weeks. Nausea and vomiting. This does not happen every day. She has not identified exacerbating factors. Sometimes nausea and vomiting are alleviated by ginger hard candy.  She has tried OTC Alka-Seltzer and Tums.  Occasionally bloating sensation and abdominal cramps, "not significant."  She denies associated fever, chills, unusual fatigue, body aches, night sweats, or abnormal weight loss. Negative for abnormal vaginal bleeding, discharge, or urinary symptoms.  Lab Results  Component Value Date   WBC 8.3 05/27/2018   HGB 13.8 05/27/2018   HCT 39.5 05/27/2018   MCV 93.6 05/27/2018   PLT 253 05/27/2018   Lab Results  Component Value Date   ALT 13 05/27/2018   AST 26 05/27/2018   ALKPHOS 64 06/23/2017   BILITOT 0.7 05/27/2018   Lab Results  Component Value Date   CREATININE 0.95 05/27/2018   BUN 11 05/27/2018   NA 134 (L) 05/27/2018   K 3.7 05/27/2018   CL 100 05/27/2018   CO2 22 05/27/2018   Lab Results  Component Value Date   TSH 1.34 05/27/2018    Also concerned about headache: Intermittently for at least 2 years but for the past 6 to 8 months she is having a headache related around menstrual  period, no every month. Bitemporal dull headache, mild photophobia, nausea, and vomiting. Headache is mild and last a day. She usually does not take OTC analgesics.  Sleeping in a dark and quite room helps with symptoms.  She has been under more stress due to changes related to COVID 19 pandemia. She is working from home and salary was reduced. Negative for depressed mood.  ROS: See pertinent positives and negatives per HPI.  Past Medical History:  Diagnosis Date  . Allergic rhinitis   . Asthma     Past Surgical History:  Procedure Laterality Date  . AUGMENTATION MAMMAPLASTY    . UMBILICAL HERNIA REPAIR  2008  . VAGINAL WOUND CLOSURE / REPAIR  4/10   RIGHT    Family History  Problem Relation Age of Onset  . Hypertension Mother   . Colon polyps Mother        pre-cancerous polyps  . Hypertension Father   . Heart disease Father   . Prostate cancer Father   . Colon polyps Father        pre-cancerous polyps  . Breast cancer Maternal Grandmother   . Colon cancer Paternal Grandmother 43  . Kidney disease Neg Hx   . Gallbladder disease Neg Hx   . Esophageal cancer Neg Hx     Social History   Socioeconomic History  . Marital status: Legally Separated    Spouse name: Not on file  . Number of children: 2  . Years of education: Not on file  . Highest education level: Not on  file  Occupational History  . 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[employer]  Social Needs  . Financial resource strain: Not on file  . Food insecurity    Worry: Not on file    Inability: Not on file  . Transportation needs    Medical: Not on file    Non-medical: Not on file  Tobacco Use  . Smoking status: Former Smoker    Quit date: 05/28/1987    Years since quitting: 31.7  . Smokeless tobacco: Never Used  Substance and Sexual Activity  . Alcohol use: Yes    Alcohol/week: 0.0 standard drinks    Comment: occ  . Drug use: No  . Sexual activity: Yes    Comment: patients  husband with vasectomy  Lifestyle  . Physical activity    Days per week: Not on file    Minutes per session: Not on file  . Stress: Not on file  Relationships  . Social Herbalist on phone: Not on file    Gets together: Not on file    Attends religious service: Not on file    Active member of club or organization: Not on file    Attends meetings of clubs or organizations: Not on file    Relationship status: Not on file  . Intimate partner violence    Fear of current or ex partner: Not on file    Emotionally abused: Not on file    Physically abused: Not on file    Forced sexual activity: Not on file  Other Topics Concern  . Not on file  Social History Narrative   Married, 2 sons   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    Current Outpatient Medications:  .  ADVAIR DISKUS 100-50 MCG/DOSE AEPB, Inhale 1 puff into the lungs 2 (two) times daily. , Disp: , Rfl:  .  benzonatate (TESSALON) 200 MG capsule, Take 1 capsule (200 mg total) by mouth 2 (two) times daily as needed for cough., Disp: 40 capsule, Rfl: 1 .  Calcium Carb-Cholecalciferol (CALCIUM 600+D3 PO), Take by mouth., Disp: , Rfl:  .  Cholecalciferol (VITAMIN D PO), Take 1 tablet by mouth daily., Disp: , Rfl:  .  clobetasol ointment (TEMOVATE) AB-123456789 %, Apply 1 application topically 2 (two) times daily., Disp: , Rfl:  .  diphenhydrAMINE (BENADRYL) 25 MG tablet, Take 25 mg by mouth as needed., Disp: , Rfl:  .  fluticasone (FLONASE) 50 MCG/ACT nasal spray, Place into both nostrils daily., Disp: , Rfl:  .  loratadine (CLARITIN) 10 MG tablet, Take 10 mg by mouth as needed. Reported on 05/29/2015, Disp: , Rfl:  .  ondansetron (ZOFRAN ODT) 4 MG disintegrating tablet, Take 1 tablet (4 mg total) by mouth every 8 (eight) hours as needed for nausea or vomiting., Disp: 20 tablet, Rfl: 0 .  SUMAtriptan (IMITREX) 50 MG tablet, Take 1 tablet (50 mg total) by mouth daily as needed for migraine. May repeat in 2 hours if headache  persists or recurs., Disp: 10 tablet, Rfl: 1 .  thiamine 100 MG tablet, Take 1 tablet (100 mg total) by mouth daily., Disp: 90 tablet, Rfl: 3 .  VENTOLIN HFA 108 (90 BASE) MCG/ACT inhaler, Inhale 1-2 puffs into the lungs as needed. Reported on 05/29/2015, Disp: , Rfl:  .  Vitamin D, Ergocalciferol, (DRISDOL) 1.25 MG (50000 UT) CAPS capsule, Take 1 capsule (50,000 Units total) by mouth every 7 (seven) days., Disp: 12 capsule, Rfl: 0  EXAM:  VITALS per patient if applicable:She does not have a BP monitor.  GENERAL: alert, oriented, appears well and in no acute distress  HEENT: atraumatic, conjunctiva clear, no obvious abnormalities on inspection.  NECK: normal movements of the head and neck  LUNGS: on inspection no signs of respiratory distress, breathing rate appears normal, no obvious gross SOB, gasping or wheezing  CV: no obvious cyanosis  Shelly: moves all visible extremities without noticeable abnormality  PSYCH/NEURO: pleasant and cooperative, no obvious depression, + anxious, speech and thought processing grossly intact  ASSESSMENT AND PLAN:  Discussed the following assessment and plan:  Diarrhea, unspecified type - Plan: Ambulatory referral to Gastroenterology Chronic problem, so I do not think blood/stool work up is needed at this time. We discussed possible etiologies. Adequate hydration. ? IBS-D Recommend trying gluten free diet and monitor for changes. GI referral placed.  Nausea and vomiting, intractability of vomiting not specified, unspecified vomiting type - Plan: ondansetron (ZOFRAN ODT) 4 MG disintegrating tablet Small portions and bland diet for now. Zofran recommended prn. Instructed about warning signs.  Migraine without aura and without status migrainosus, not intractable - Plan: SUMAtriptan (IMITREX) 50 MG tablet Recommend trying imitrex upon acute onset.  Hormonal treatment with OCP may help. Instructed about warning signs.   I discussed the assessment  and treatment plan with the patient.She was provided an opportunity to ask questions and all were answered. She agreed with the plan and demonstrated an understanding of the instructions.   The patient was advised to call back or seek an in-person evaluation if the symptoms worsen or if the condition fails to improve as anticipated.  Return in about 3 months (around 05/27/2019) for HA.    Betty [place], MD

## 2019-03-04 ENCOUNTER — Telehealth: Payer: Self-pay

## 2019-03-04 NOTE — Telephone Encounter (Signed)
Periods have always been regular every 28 days x 4-5 days.  This month period started at Day 63 (last Saturday) and is still going with a regular flow although it appears it might be getting a little heavier this afternoon.

## 2019-03-04 NOTE — Telephone Encounter (Signed)
So it seems that patient had premenstrual spotting and now is having her period...  If abnormal bleeding x 3 cycles, schedule visit with a Pelvic US.

## 2019-03-04 NOTE — Telephone Encounter (Signed)
Patient advised.

## 2019-03-11 ENCOUNTER — Other Ambulatory Visit: Payer: Self-pay

## 2019-03-11 ENCOUNTER — Ambulatory Visit (INDEPENDENT_AMBULATORY_CARE_PROVIDER_SITE_OTHER): Payer: 59 | Admitting: Gastroenterology

## 2019-03-11 ENCOUNTER — Encounter: Payer: Self-pay | Admitting: Gastroenterology

## 2019-03-11 ENCOUNTER — Other Ambulatory Visit (INDEPENDENT_AMBULATORY_CARE_PROVIDER_SITE_OTHER): Payer: 59

## 2019-03-11 VITALS — BP 118/84 | HR 86 | Ht 61.5 in | Wt 116.2 lb

## 2019-03-11 DIAGNOSIS — R197 Diarrhea, unspecified: Secondary | ICD-10-CM | POA: Diagnosis not present

## 2019-03-11 DIAGNOSIS — R112 Nausea with vomiting, unspecified: Secondary | ICD-10-CM

## 2019-03-11 LAB — COMPREHENSIVE METABOLIC PANEL
ALT: 36 U/L — ABNORMAL HIGH (ref 0–35)
AST: 64 U/L — ABNORMAL HIGH (ref 0–37)
Albumin: 3.5 g/dL (ref 3.5–5.2)
Alkaline Phosphatase: 64 U/L (ref 39–117)
BUN: 10 mg/dL (ref 6–23)
CO2: 23 mEq/L (ref 19–32)
Calcium: 8.5 mg/dL (ref 8.4–10.5)
Chloride: 103 mEq/L (ref 96–112)
Creatinine, Ser: 0.83 mg/dL (ref 0.40–1.20)
GFR: 73.11 mL/min (ref >60.00)
Glucose, Bld: 100 mg/dL — ABNORMAL HIGH (ref 70–99)
Potassium: 4.2 mEq/L (ref 3.5–5.1)
Sodium: 136 mEq/L (ref 135–145)
Total Bilirubin: 0.5 mg/dL (ref 0.2–1.2)
Total Protein: 6.2 g/dL (ref 6.0–8.3)

## 2019-03-11 LAB — CBC WITH DIFFERENTIAL/PLATELET
Basophils Absolute: 0.1 10*3/uL (ref 0.0–0.1)
Basophils Relative: 1.1 % (ref 0.0–3.0)
Eosinophils Absolute: 0.1 10*3/uL (ref 0.0–0.7)
Eosinophils Relative: 2.2 % (ref 0.0–5.0)
HCT: 40.2 % (ref 36.0–46.0)
Hemoglobin: 13.8 g/dL (ref 12.0–15.0)
Lymphocytes Relative: 27.1 % (ref 12.0–46.0)
Lymphs Abs: 1.5 10*3/uL (ref 0.7–4.0)
MCHC: 34.4 g/dL (ref 30.0–36.0)
MCV: 96.8 fl (ref 78.0–100.0)
Monocytes Absolute: 0.4 10*3/uL (ref 0.1–1.0)
Monocytes Relative: 7.1 % (ref 3.0–12.0)
Neutro Abs: 3.4 10*3/uL (ref 1.4–7.7)
Neutrophils Relative %: 62.5 % (ref 43.0–77.0)
Platelets: 245 10*3/uL (ref 150.0–400.0)
RBC: 4.15 Mil/uL (ref 3.87–5.11)
RDW: 14.3 % (ref 11.5–15.5)
WBC: 5.4 10*3/uL (ref 4.0–10.5)

## 2019-03-11 LAB — TSH: TSH: 1.1 u[IU]/mL (ref 0.35–4.50)

## 2019-03-11 LAB — IGA: IgA: 262 mg/dL (ref 68–378)

## 2019-03-11 MED ORDER — PANTOPRAZOLE SODIUM 40 MG PO TBEC: 40.0000 mg | DELAYED_RELEASE_TABLET | Freq: Every day | ORAL | 2 refills | Status: DC

## 2019-03-11 NOTE — Patient Instructions (Signed)
Your provider has requested that you go to the basement level for lab work before leaving today. Press "B" on the elevator. The lab is located at the first door on the left as you exit the elevator.  We have sent the following medications to your pharmacy for you to pick up at your convenience: pantoprazole.   Continue Zofran as needed for nausea and vomiting.   You have been scheduled for an abdominal ultrasound at [facility] (1st floor of hospital) on 03/15/19 at 8:00am. Please arrive 15 minutes prior to your appointment for registration. Make certain not to have anything to eat or drink 6 hours prior to your appointment. Should you need to reschedule your appointment, please contact radiology at [phone]. This test typically takes about 30 minutes to perform.

## 2019-03-11 NOTE — Progress Notes (Signed)
03/11/2019 Shelly Edwards Shelly Edwards [id number] 12/05/1970   HISTORY OF PRESENT ILLNESS: This is a 48 year old female who is a patient of Dr. Celesta Aver.  She had a colonoscopy in April 2016 at which time it showed normal exam with excellent prep.  Due to family history of colon/rectal cancer was recommended she have a repeat exam in 5 years.  Anyway, she presents to our office today with complaints of intermittent episodes of nausea and vomiting.  She says that this has been going on for about a year or 2, but has become much more frequent over the past couple of months, now occurring about once a week or so.  She says it occurs randomly throughout the day, not necessarily associated with eating.  She says it comes on very fast.  At first she thought it was somewhat linked to her menstrual cycle, but occurs so frequently now that she does not think that is it.  She also reports diarrhea as well saying that she very seldom has a solid stool.  Denies any rectal bleeding.  Denies any specific abdominal pain per se.  Denies any overt heartburn or reflux type symptoms.  Has history of migraines.  Referred here on the occasion by her PCP, Dr. Betty [place].   Past Medical History:  Diagnosis Date  . Allergic rhinitis   . Asthma    Past Surgical History:  Procedure Laterality Date  . AUGMENTATION MAMMAPLASTY    . UMBILICAL HERNIA REPAIR  2008  . VAGINAL WOUND CLOSURE / REPAIR  4/10   RIGHT    reports that she quit smoking about 31 years ago. She has never used smokeless tobacco. She reports current alcohol use. She reports that she does not use drugs. family history includes Breast cancer in her maternal grandmother; Colon cancer (age of onset: 14) in her paternal grandmother; Colon polyps in her father and mother; Heart disease in her father; Hypertension in her father and mother; Prostate cancer in her father. Allergies  Allergen Reactions  . Molds & Smuts Itching and Shortness Of  Breath      Outpatient Encounter Medications as of 03/11/2019  Medication Sig  . ADVAIR DISKUS 100-50 MCG/DOSE AEPB Inhale 1 puff into the lungs 2 (two) times daily.   . Calcium Carb-Cholecalciferol (CALCIUM 600+D3 PO) Take by mouth.  . Cholecalciferol (VITAMIN D PO) Take 1 tablet by mouth daily.  . clobetasol ointment (TEMOVATE) AB-123456789 % Apply 1 application topically 2 (two) times daily.  . diphenhydrAMINE (BENADRYL) 25 MG tablet Take 25 mg by mouth as needed.  . fluticasone (FLONASE) 50 MCG/ACT nasal spray Place into both nostrils daily.  . SUMAtriptan (IMITREX) 50 MG tablet Take 1 tablet (50 mg total) by mouth daily as needed for migraine. May repeat in 2 hours if headache persists or recurs.  . thiamine 100 MG tablet Take 1 tablet (100 mg total) by mouth daily.  . VENTOLIN HFA 108 (90 BASE) MCG/ACT inhaler Inhale 1-2 puffs into the lungs as needed. Reported on 05/29/2015  . [DISCONTINUED] benzonatate (TESSALON) 200 MG capsule Take 1 capsule (200 mg total) by mouth 2 (two) times daily as needed for cough. (Patient not taking: Reported on 03/11/2019)  . [DISCONTINUED] loratadine (CLARITIN) 10 MG tablet Take 10 mg by mouth as needed. Reported on 05/29/2015  . [DISCONTINUED] ondansetron (ZOFRAN ODT) 4 MG disintegrating tablet Take 1 tablet (4 mg total) by mouth every 8 (eight) hours as needed for nausea or vomiting. (Patient not taking: Reported on  03/11/2019)  . [DISCONTINUED] Vitamin D, Ergocalciferol, (DRISDOL) 1.25 MG (50000 UT) CAPS capsule Take 1 capsule (50,000 Units total) by mouth every 7 (seven) days. (Patient not taking: Reported on 03/11/2019)   No facility-administered encounter medications on file as of 03/11/2019.      REVIEW OF SYSTEMS  : All other systems reviewed and negative except where noted in the History of Present Illness.   PHYSICAL EXAM: BP 118/84   Pulse 86   Ht 5' 1.5" (1.562 m)   Wt 116 lb 3.2 oz (52.7 kg)   SpO2 96%   BMI 21.60 kg/m  General: Well developed  white female in no acute distress Head: Normocephalic and atraumatic Eyes:  Sclerae anicteric, conjunctiva pink. Ears: Normal auditory acuity Lungs: Clear throughout to auscultation; no increased WOB. Heart: Regular rate and rhythm; no M/R/G. Abdomen: Soft, non-distended.  BS present.  Non-tender. Musculoskeletal: Symmetrical with no gross deformities  Skin: No lesions on visible extremities Extremities: No edema  Neurological: Alert oriented x 4, grossly non-focal Psychological:  Alert and cooperative. Normal mood and affect  ASSESSMENT AND PLAN: *48 year old female with complaints of nausea and intermittent vomiting as well as complaints of diarrhea.  Unsure if these issues are related or two separate things.  Will start with labs including CBC, CMP, TSH, and celiac labs.  Will check abdominal ultrasound as well to evaluate gallbladder and ? If she may need a HIDA scan to rule out biliary dyskinesia as cause of symptoms pending results and her course.  Will start pantoprazole 40 mg daily as well to see if some of the nausea and vomiting may be related to reflux, which would not explain the diarrhea, however.  ? Trial of powder fiber supplement to help with bulking the stools.  CC:  [place], Betty G, MD

## 2019-03-14 ENCOUNTER — Other Ambulatory Visit: Payer: Self-pay

## 2019-03-14 ENCOUNTER — Telehealth: Payer: Self-pay | Admitting: Gastroenterology

## 2019-03-14 ENCOUNTER — Ambulatory Visit: Payer: 59 | Admitting: Women's Health

## 2019-03-14 ENCOUNTER — Encounter: Payer: Self-pay | Admitting: Women's Health

## 2019-03-14 VITALS — BP 134/80

## 2019-03-14 DIAGNOSIS — N898 Other specified noninflammatory disorders of vagina: Secondary | ICD-10-CM

## 2019-03-14 DIAGNOSIS — N938 Other specified abnormal uterine and vaginal bleeding: Secondary | ICD-10-CM | POA: Diagnosis not present

## 2019-03-14 LAB — WET PREP FOR TRICH, YEAST, CLUE

## 2019-03-14 LAB — HCG, SERUM, QUALITATIVE: Preg, Serum: NEGATIVE

## 2019-03-14 LAB — TISSUE TRANSGLUTAMINASE, IGA: (tTG) Ab, IgA: 1 U/mL

## 2019-03-14 MED ORDER — MEGESTROL ACETATE 40 MG PO TABS: 40.0000 mg | ORAL_TABLET | Freq: Two times a day (BID) | ORAL | 0 refills | Status: DC

## 2019-03-14 NOTE — Patient Instructions (Signed)
Abnormal Uterine Bleeding Abnormal uterine bleeding is unusual bleeding from the uterus. It includes:  Bleeding or spotting between periods.  Bleeding after sex.  Bleeding that is heavier than normal.  Periods that last longer than usual.  Bleeding after menopause. Abnormal uterine bleeding can affect women at various stages in life, including teenagers, women in their reproductive years, pregnant women, and women who have reached menopause. Common causes of abnormal uterine bleeding include:  Pregnancy.  Growths of tissue (polyps).  A noncancerous tumor in the uterus (fibroid).  Infection.  Cancer.  Hormonal imbalances. Any type of abnormal bleeding should be evaluated by a health care provider. Many cases are minor and simple to treat, while others are more serious. Treatment will depend on the cause of the bleeding. Follow these instructions at home:  Monitor your condition for any changes.  Do not use tampons, douche, or have sex if told by your health care provider.  Change your pads often.  Get regular exams that include pelvic exams and cervical cancer screening.  Keep all follow-up visits as told by your health care provider. This is important. Contact a health care provider if:  Your bleeding lasts for more than one week.  You feel dizzy at times.  You feel nauseous or you vomit. Get help right away if:  You pass out.  Your bleeding soaks through a pad every hour.  You have abdominal pain.  You have a fever.  You become sweaty or weak.  You pass large blood clots from your vagina. Summary  Abnormal uterine bleeding is unusual bleeding from the uterus.  Any type of abnormal bleeding should be evaluated by a health care provider. Many cases are minor and simple to treat, while others are more serious.  Treatment will depend on the cause of the bleeding. This information is not intended to replace advice given to you by your health care provider.  Make sure you discuss any questions you have with your health care provider. Document Released: 05/26/2005 Document Revised: 09/02/2017 Document Reviewed: 06/27/2016 Elsevier Patient Education  2020 Elsevier Inc.  

## 2019-03-14 NOTE — Progress Notes (Signed)
48 yo DWF G3 P2 with complaint of irregular menstrual cycle, usually has a monthly every 28-day cycle, last cycle started on day 19 days and has now been bleeding for 17 days.  Some days heavier than others but bleeding daily.  Same partner greater than 2 years with negative STD screen.  Has had occasional vaginal itching done at this time, denies abdominal/back pain, nausea or fever.  Using no contraception.  First time this happened. History of a normal TSH.  Healthy lifestyle of regular exercise and diet.  Denies any other significant life stressors at this time.  Has had some problems with GI symptoms and has recently seen gastroenterologist.    Exam: Appears well.  No CVAT.  Abdomen soft, nontender, no rebound, external genitalia mild erythema, speculum exam moderate amount of menses type blood noted, wet prep negative, bimanual no CMT or adnexal tenderness.  Uterus small.  GC/chlamydia culture taken.  DUB x1 month  Plan: Qualitative hCG, if negative Megace 40 mg twice daily for 10 days.  Continue menstrual calendar if cycles more frequent than every 21 days or lasts greater than 7 days instructed to call.  We will proceed with sonohysterogram with Dr. Dellis Filbert.  Encouraged condoms.  Menopause discussed.

## 2019-03-14 NOTE — Telephone Encounter (Signed)
Pt concerned about the cost of the [place]. She would like to wait and see if the medications help her symptoms and see what her labs show before having the [place]. She knows to call us back if she decides to proceed with [place]. Alonza Bogus PA notified.

## 2019-03-14 NOTE — Telephone Encounter (Signed)
Pt would like more information about [place] of her gallbladder that Janett Billow ordered before pt makes a decision. Pls call her.

## 2019-03-15 ENCOUNTER — Ambulatory Visit (HOSPITAL_COMMUNITY): Payer: 59

## 2019-03-15 ENCOUNTER — Telehealth: Payer: Self-pay | Admitting: Gastroenterology

## 2019-03-15 ENCOUNTER — Encounter: Payer: Self-pay | Admitting: *Deleted

## 2019-03-15 NOTE — Telephone Encounter (Signed)
Noted  

## 2019-03-15 NOTE — Telephone Encounter (Signed)
Returned calls, left message, sent patient a MyChart message.

## 2019-03-15 NOTE — Telephone Encounter (Signed)
Pt called inquiring about her lab results. Pls call her.

## 2019-03-15 NOTE — Progress Notes (Signed)
LMVM for the patient to contact the office to schedule a follow up appointment with Dr. Martinique around 12/018/2020.

## 2019-03-15 NOTE — Telephone Encounter (Signed)
Ok.  Ultrasound is pretty conservative and basic, usually not too costly, but up to her.  Just please save the message to her chart.  Thank you,  Jess

## 2019-03-16 ENCOUNTER — Encounter: Payer: Self-pay | Admitting: Gastroenterology

## 2019-03-16 DIAGNOSIS — R197 Diarrhea, unspecified: Secondary | ICD-10-CM | POA: Insufficient documentation

## 2019-03-16 DIAGNOSIS — K589 Irritable bowel syndrome without diarrhea: Secondary | ICD-10-CM | POA: Insufficient documentation

## 2019-03-16 DIAGNOSIS — R112 Nausea with vomiting, unspecified: Secondary | ICD-10-CM | POA: Insufficient documentation

## 2019-03-16 LAB — C. TRACHOMATIS/N. GONORRHOEAE RNA
C. trachomatis RNA, TMA: NOT DETECTED
N. gonorrhoeae RNA, TMA: NOT DETECTED

## 2019-03-24 ENCOUNTER — Telehealth: Payer: Self-pay | Admitting: Gastroenterology

## 2019-03-24 DIAGNOSIS — R1011 Right upper quadrant pain: Secondary | ICD-10-CM

## 2019-03-24 DIAGNOSIS — R197 Diarrhea, unspecified: Secondary | ICD-10-CM

## 2019-03-24 DIAGNOSIS — R112 Nausea with vomiting, unspecified: Secondary | ICD-10-CM

## 2019-03-24 NOTE — Telephone Encounter (Signed)
The patient has been notified of this information and all questions answered.    You have been scheduled for an abdominal ultrasound at [facility] (1st floor of hospital) on 03/29/19 at 8 am. Please arrive 15 minutes prior to your appointment for registration. Make certain not to have anything to eat or drink 6 hours prior to your appointment. Should you need to reschedule your appointment, please contact radiology at [phone]. This test typically takes about 30 minutes to perform.

## 2019-03-24 NOTE — Telephone Encounter (Signed)
Left message on machine to call back  

## 2019-03-24 NOTE — Telephone Encounter (Signed)
Pt is calling and stated when she had appt with Janett Billow that she had suggested that if the pt wanted to go forward with a Gallbladder ultrasound that they could and the patient is wanting to see if she can get that schedule that she would like to have it done.

## 2019-03-29 ENCOUNTER — Ambulatory Visit (HOSPITAL_COMMUNITY)
Admission: RE | Admit: 2019-03-29 | Discharge: 2019-03-29 | Disposition: A | Discharge status: Home / Self Care | Payer: 59 | Source: Ambulatory Visit | Attending: Gastroenterology | Admitting: Gastroenterology

## 2019-03-29 ENCOUNTER — Other Ambulatory Visit: Payer: Self-pay

## 2019-03-29 DIAGNOSIS — R112 Nausea with vomiting, unspecified: Secondary | ICD-10-CM | POA: Diagnosis not present

## 2019-03-29 DIAGNOSIS — R197 Diarrhea, unspecified: Secondary | ICD-10-CM | POA: Insufficient documentation

## 2019-03-29 DIAGNOSIS — R1011 Right upper quadrant pain: Secondary | ICD-10-CM | POA: Diagnosis present

## 2019-04-01 ENCOUNTER — Telehealth: Payer: Self-pay | Admitting: Gastroenterology

## 2019-04-01 NOTE — Telephone Encounter (Signed)
Pt inquired about US results.  °

## 2019-04-01 NOTE — Telephone Encounter (Signed)
Jess have you reviewed the [place] results the pt is calling.  Thanks

## 2019-04-04 ENCOUNTER — Other Ambulatory Visit: Payer: Self-pay

## 2019-04-04 DIAGNOSIS — R112 Nausea with vomiting, unspecified: Secondary | ICD-10-CM

## 2019-04-04 DIAGNOSIS — R197 Diarrhea, unspecified: Secondary | ICD-10-CM

## 2019-04-04 DIAGNOSIS — R1084 Generalized abdominal pain: Secondary | ICD-10-CM

## 2019-04-08 ENCOUNTER — Other Ambulatory Visit: Payer: Self-pay | Admitting: Family Medicine

## 2019-04-08 DIAGNOSIS — R112 Nausea with vomiting, unspecified: Secondary | ICD-10-CM

## 2019-04-08 NOTE — Telephone Encounter (Signed)
Medication is no longer on the patients med list. Should they continue this medication?

## 2019-04-12 NOTE — Telephone Encounter (Signed)
Pt called stating she only has one pill and is requesting an update. Please advise.

## 2019-04-14 ENCOUNTER — Telehealth: Payer: Self-pay | Admitting: Gastroenterology

## 2019-04-14 NOTE — Telephone Encounter (Signed)
Pls call pt. She is scheduled for a test on Friday 11/6 but she states wants to know more about test and also about her [place] results. She states that she has not spoken with Janett Billow about this and the only person that she spoke with was not very informative.

## 2019-04-14 NOTE — Telephone Encounter (Signed)
I called the pt and spoke with her regarding the HIDA scan and we discussed in length the process and what a HIDA scan is looking for.  I answered all questions and verified that she did receive the information in written form via My Chart.

## 2019-04-15 ENCOUNTER — Ambulatory Visit (HOSPITAL_COMMUNITY)
Admission: RE | Admit: 2019-04-15 | Discharge: 2019-04-15 | Disposition: A | Discharge status: Home / Self Care | Payer: 59 | Source: Ambulatory Visit | Attending: Gastroenterology | Admitting: Gastroenterology

## 2019-04-15 ENCOUNTER — Other Ambulatory Visit: Payer: Self-pay

## 2019-04-15 DIAGNOSIS — R1084 Generalized abdominal pain: Secondary | ICD-10-CM | POA: Diagnosis not present

## 2019-04-15 DIAGNOSIS — R197 Diarrhea, unspecified: Secondary | ICD-10-CM | POA: Diagnosis present

## 2019-04-15 DIAGNOSIS — R112 Nausea with vomiting, unspecified: Secondary | ICD-10-CM

## 2019-04-15 MED ORDER — TECHNETIUM TC 99M MEBROFENIN IV KIT
5.4000 | PACK | Freq: Once | INTRAVENOUS | Status: AC | PRN
  Administered 2019-04-15: 5.4 via INTRAVENOUS

## 2019-04-21 ENCOUNTER — Ambulatory Visit (INDEPENDENT_AMBULATORY_CARE_PROVIDER_SITE_OTHER): Payer: 59 | Admitting: Internal Medicine

## 2019-04-21 ENCOUNTER — Other Ambulatory Visit: Payer: Self-pay

## 2019-04-21 VITALS — Ht 61.5 in | Wt 115.0 lb

## 2019-04-21 DIAGNOSIS — R5383 Other fatigue: Secondary | ICD-10-CM | POA: Diagnosis not present

## 2019-04-21 DIAGNOSIS — R932 Abnormal findings on diagnostic imaging of liver and biliary tract: Secondary | ICD-10-CM

## 2019-04-21 DIAGNOSIS — R112 Nausea with vomiting, unspecified: Secondary | ICD-10-CM

## 2019-04-21 DIAGNOSIS — R748 Abnormal levels of other serum enzymes: Secondary | ICD-10-CM

## 2019-04-21 DIAGNOSIS — L659 Nonscarring hair loss, unspecified: Secondary | ICD-10-CM

## 2019-04-21 DIAGNOSIS — K529 Noninfective gastroenteritis and colitis, unspecified: Secondary | ICD-10-CM

## 2019-04-21 DIAGNOSIS — Z800 Family history of malignant neoplasm of digestive organs: Secondary | ICD-10-CM

## 2019-04-21 MED ORDER — ONDANSETRON 8 MG PO TBDP: 8.0000 mg | ORAL_TABLET | Freq: Three times a day (TID) | ORAL | 0 refills | Status: DC | PRN

## 2019-04-21 NOTE — Progress Notes (Signed)
TELEHEALTH ENCOUNTER IN SETTING OF COVID-19 PANDEMIC - REQUESTED BY PATIENT SERVICE PROVIDED BY TELEMEDECINE - TYPE: doximity AV PATIENT LOCATION: home PATIENT HAS CONSENTED TO TELEHEALTH VISIT PROVIDER LOCATION: OFFICE PARTICIPANTS OTHER THAN PATIENT:none TIME SPENT ON CALL:27 moins    Shelly Edwards 48 y.o. 23-Sep-1970 [id number]  Assessment & Plan:   Encounter Diagnoses  Name Primary?   Intractable vomiting with nausea, unspecified vomiting type Yes   Chronic diarrhea    Family hx of colon cancer    Fatigue, unspecified type    Hair loss    Abnormal transaminases    Abnormal ultrasound of liver - increased hepatic echogenicity     Stop PPI -  Not helping  EGD to evaluate upper gi sxs and diarrhea Colonoscopy to evaluate diarrhea  Refill ondansetron at 8 mg  ? IBD  The risks and benefits as well as alternatives of endoscopic procedure(s) have been discussed and reviewed. All questions answered. The patient agrees to proceed.  I appreciate the opportunity to care for this patient.  [id number], Shelly So, MD   Subjective:   Chief Complaint: nausea and vomiting, diarrhea  HPI Shelly Edwards has been having problems for months, probably most of the year with episodic nausea vomiting and diarrhea and variable patterns.  She had seen Shelly Edwards's there and there was some abnormal LFTs with a transaminase elevation, AST 64 ALT 36.  This is not a new issue for her she has had previous negative hepatitis serologies.  She had a negative autoimmune work-up with ANA and citric C related protein and sed rate and C-reactive protein in 2018 as well.  All negative.  Ultrasound this time demonstrated some increased echogenicity of the liver but no gallstones.  She had a HIDA scan with a normal ejection fraction using the Ensure protocol.  She continues to have problems with episodic nausea which can be intense vomiting and diarrhea.  She will have normal stools at times.   Symptoms are not nocturnal or not associated bleeding or fever and her weight has been stable.  She also had a negative celiac serologies.  Not much abdominal pain.  Colonoscopy 2016 - because of family history of colon polyps in parents and a grandparent with colon cancer.  Other recent issues have been increasing headaches that were bad she started sumatriptan which relieved her headaches.  Or most of them.  Those had increased with her cycle, she had a prolonged episode of menstrual bleeding was seen in early October and had a negative pregnancy test, was to start Megace but the menses stopped Edwards she did not use that and she had a regular cycle though less bleeding after that.  She has had fatigue and over the past couple of years she has had some thinning of her hair.  She has been through a stressful divorce and did lose 10 pounds a couple years ago but does not lose weight now and has a good appetite.  Ondansetron helps her symptoms if she catches them soon enough.  There is been no sick contacts and no antecedent illness prior to all this i.e. no gastroenteritis or etc. as a trigger.  Wt Readings from Last 3 Encounters:  04/21/19 115 lb (52.2 kg)  03/11/19 116 lb 3.2 oz (52.7 kg)  06/11/18 113 lb 7 oz (51.5 kg)    Allergies  Allergen Reactions   Molds & Smuts Itching and Shortness Of Breath   Current Meds  Medication Sig   ADVAIR DISKUS 100-50 MCG/DOSE  AEPB Inhale 1 puff into the lungs 2 (two) times daily.    Calcium Carb-Cholecalciferol (CALCIUM 600+D3 PO) Take by mouth.   Cholecalciferol (VITAMIN D PO) Take 1 tablet by mouth daily.   clobetasol ointment (TEMOVATE) [id number] % Apply 1 application topically 2 (two) times daily.   diphenhydrAMINE (BENADRYL) 25 MG tablet Take 25 mg by mouth as needed.   fluticasone (FLONASE) 50 MCG/ACT nasal spray Place into both nostrils daily.   ondansetron (ZOFRAN-ODT) 4 MG disintegrating tablet Take 1 tablet (4 mg total) by mouth daily as needed  for nausea or vomiting (Headache with nausea around menses.).   pantoprazole (PROTONIX) 40 MG tablet Take 1 tablet (40 mg total) by mouth daily.   SUMAtriptan (IMITREX) 50 MG tablet Take 1 tablet (50 mg total) by mouth daily as needed for migraine. May repeat in 2 hours if headache persists or recurs.   thiamine 100 MG tablet Take 1 tablet (100 mg total) by mouth daily.   VENTOLIN HFA 108 (90 BASE) MCG/ACT inhaler Inhale 1-2 puffs into the lungs as needed. Reported on 05/29/2015   Past Medical History:  Diagnosis Date   Allergic rhinitis    Asthma    Past Surgical History:  Procedure Laterality Date   AUGMENTATION MAMMAPLASTY     UMBILICAL HERNIA REPAIR  2008   VAGINAL WOUND CLOSURE / REPAIR  4/10   RIGHT   Social History   Social History Narrative   Married, 2 sons   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[employer]   2-3 caffeine drinks daily   family history includes Breast cancer in her maternal grandmother; Colon cancer (age of onset: 76) in her paternal grandmother; Colon polyps in her father and mother; Heart disease in her father; Hypertension in her father and mother; Prostate cancer in her father.   Review of Systems As above  PE limited  Ht 5' 1.5" (1.562 m)    Wt 115 lb (52.2 kg)    LMP 04/03/2019    BMI 21.38 kg/m  WDWN NAD Appropriate mood and affect

## 2019-04-21 NOTE — Patient Instructions (Signed)
As we discussed on the video call today I have recommended an upper endoscopy and a colonoscopy to evaluate the nausea and vomiting and the diarrhea.   I sent the prescription for 8 mg orally disintegrating tablet ondansetron to your pharmacy.   Please stop taking pantoprazole.  I took that off your medication list.   I appreciate the opportunity to care for you.  Gatha Mayer, MD, [facility]

## 2019-04-29 ENCOUNTER — Encounter: Payer: Self-pay | Admitting: Internal Medicine

## 2019-04-29 NOTE — Telephone Encounter (Signed)
Error

## 2019-05-02 ENCOUNTER — Encounter: Payer: Self-pay | Admitting: Internal Medicine

## 2019-05-11 ENCOUNTER — Other Ambulatory Visit: Payer: Self-pay | Admitting: Family Medicine

## 2019-05-11 DIAGNOSIS — G43009 Migraine without aura, not intractable, without status migrainosus: Secondary | ICD-10-CM

## 2019-05-11 MED ORDER — SUMATRIPTAN SUCCINATE 50 MG PO TABS: 50.0000 mg | ORAL_TABLET | Freq: Every day | ORAL | 1 refills | Status: DC | PRN

## 2019-05-11 NOTE — Telephone Encounter (Signed)
SUMAtriptan (IMITREX) 50 MG tablet  2nd request  [id number] - [place], [place] - [address] AT [address] [phone] (Phone) [phone] (Fax)   Please FU with pt if any issues

## 2019-05-23 ENCOUNTER — Other Ambulatory Visit: Payer: Self-pay | Admitting: Internal Medicine

## 2019-05-25 ENCOUNTER — Encounter: Payer: Self-pay | Admitting: Internal Medicine

## 2019-05-25 ENCOUNTER — Other Ambulatory Visit: Payer: Self-pay

## 2019-05-25 ENCOUNTER — Ambulatory Visit (AMBULATORY_SURGERY_CENTER): Payer: 59 | Admitting: *Deleted

## 2019-05-25 VITALS — Temp 96.6°F | Ht 61.0 in | Wt 119.0 lb

## 2019-05-25 DIAGNOSIS — R112 Nausea with vomiting, unspecified: Secondary | ICD-10-CM

## 2019-05-25 DIAGNOSIS — K529 Noninfective gastroenteritis and colitis, unspecified: Secondary | ICD-10-CM

## 2019-05-25 DIAGNOSIS — Z1159 Encounter for screening for other viral diseases: Secondary | ICD-10-CM

## 2019-05-25 DIAGNOSIS — Z1211 Encounter for screening for malignant neoplasm of colon: Secondary | ICD-10-CM

## 2019-05-25 NOTE — Progress Notes (Signed)

## 2019-05-26 ENCOUNTER — Ambulatory Visit (INDEPENDENT_AMBULATORY_CARE_PROVIDER_SITE_OTHER): Payer: 59 | Admitting: Obstetrics & Gynecology

## 2019-05-26 ENCOUNTER — Encounter: Payer: Self-pay | Admitting: Obstetrics & Gynecology

## 2019-05-26 VITALS — BP 140/90 | Ht 61.5 in | Wt 116.0 lb

## 2019-05-26 DIAGNOSIS — Z3009 Encounter for other general counseling and advice on contraception: Secondary | ICD-10-CM

## 2019-05-26 DIAGNOSIS — Z01419 Encounter for gynecological examination (general) (routine) without abnormal findings: Secondary | ICD-10-CM

## 2019-05-26 DIAGNOSIS — N870 Mild cervical dysplasia: Secondary | ICD-10-CM | POA: Diagnosis not present

## 2019-05-26 DIAGNOSIS — R87810 Cervical high risk human papillomavirus (HPV) DNA test positive: Secondary | ICD-10-CM

## 2019-05-26 NOTE — Progress Notes (Signed)
Shelly Edwards [birth year] [id number]   History:    48 y.o.  G2P2L2 Divorced.  Stable boyfriend.  Sons 4 and 52 yo.  RP:  Established patient presenting for annual gyn exam   HPI: Menstrual periods normal and regular every month.  No breakthrough bleeding.  No pelvic pain.  Sexually active with no pain with intercourse.  Not using contraception.  LMP 05/01/19.  Breasts normal.  Body mass index 21.56.  Exercising regularly.  Fasting health labs with Fam MD.  Family history of colon cancer.  Colonoscopy 2016.  Past medical history,surgical history, family history and social history were all reviewed and documented in the EPIC chart.  Gynecologic History Patient's last menstrual period was 05/01/2019.  Obstetric History OB History  Gravida Para Term Preterm AB Living  2 2 2     2   SAB TAB Ectopic Multiple Live Births          2    # Outcome Date GA Lbr Len/2nd Weight Sex Delivery Anes PTL Lv  2 Term     M Vag-Spont  N LIV  1 Term     M Vag-Spont  N LIV     ROS: A ROS was performed and pertinent positives and negatives are included in the history.  GENERAL: No fevers or chills. HEENT: No change in vision, no earache, sore throat or sinus congestion. NECK: No pain or stiffness. CARDIOVASCULAR: No chest pain or pressure. No palpitations. PULMONARY: No shortness of breath, cough or wheeze. GASTROINTESTINAL: No abdominal pain, nausea, vomiting or diarrhea, melena or bright red blood per rectum. GENITOURINARY: No urinary frequency, urgency, hesitancy or dysuria. MUSCULOSKELETAL: No joint or muscle pain, no back pain, no recent trauma. DERMATOLOGIC: No rash, no itching, no lesions. ENDOCRINE: No polyuria, polydipsia, no heat or cold intolerance. No recent change in weight. HEMATOLOGICAL: No anemia or easy bruising or bleeding. NEUROLOGIC: No headache, seizures, numbness, tingling or weakness. PSYCHIATRIC: No depression, no loss of interest in normal activity or change in sleep  pattern.     Exam:   BP 140/90   Ht 5' 1.5" (1.562 m)   Wt 116 lb (52.6 kg)   LMP 05/01/2019   BMI 21.56 kg/m   Body mass index is 21.56 kg/m.  General appearance : Well developed well nourished female. No acute distress HEENT: Eyes: no retinal hemorrhage or exudates,  Neck supple, trachea midline, no carotid bruits, no thyroidmegaly Lungs: Clear to auscultation, no rhonchi or wheezes, or rib retractions  Heart: Regular rate and rhythm, no murmurs or gallops Breast:Examined in sitting and supine position were symmetrical in appearance, no palpable masses or tenderness,  no skin retraction, no nipple inversion, no nipple discharge, no skin discoloration, no axillary or supraclavicular lymphadenopathy Abdomen: no palpable masses or tenderness, no rebound or guarding Extremities: no edema or skin discoloration or tenderness  Pelvic: Vulva: Normal             Vagina: No gross lesions or discharge  Cervix: No gross lesions or discharge.  Pap reflex done.  Uterus  AV, normal size, shape and consistency, non-tender and mobile  Adnexa  Without masses or tenderness  Anus: Normal   Assessment/Plan:  48 y.o. female for annual exam   1. Encounter for routine gynecological examination with Papanicolaou smear of cervix Normal gynecologic exam.  Pap reflex done.  Breast exam normal.  Screening mammo 07/2018 negative.  BMI good at 21.56.  Continue with fitness/healthy nutrition.  Health labs with Fam MD.  Fam h/o Colon Ca.  Colonoscopy 2016.  2. Encounter for other general counseling or advice on contraception Strongly recommend contraception until 2 yrs into menopause.  Will f/u for Mirena IUD insertion.  Recommend condoms in the meantime.  3. Dysplasia of cervix, low grade (CIN 1) Pap reflex done today.  4. Cervical high risk HPV (human papillomavirus) test positive Pap reflex done today.  Princess Bruins MD, 9:39 AM 05/26/2019

## 2019-05-26 NOTE — Patient Instructions (Signed)
1. Encounter for routine gynecological examination with Papanicolaou smear of cervix Normal gynecologic exam.  Pap reflex done.  Breast exam normal.  Screening mammo 07/2018 negative.  BMI good at 21.56.  Continue with fitness/healthy nutrition.  Health labs with Fam MD.  Fam h/o Colon Ca.  Colonoscopy 2016.  2. Encounter for other general counseling or advice on contraception Strongly recommend contraception until 2 yrs into menopause.  Will f/u for Mirena IUD insertion.  Recommend condoms in the meantime.  3. Dysplasia of cervix, low grade (CIN 1) Pap reflex done today.  4. Cervical high risk HPV (human papillomavirus) test positive Pap reflex done today.  Shelly Edwards, it was a pleasure seeing you today!  I will inform you of your results as soon as they are available.

## 2019-05-31 LAB — PAP IG W/ RFLX HPV ASCU

## 2019-05-31 LAB — HUMAN PAPILLOMAVIRUS, HIGH RISK: HPV DNA High Risk: NOT DETECTED

## 2019-06-01 ENCOUNTER — Telehealth: Payer: Self-pay

## 2019-06-01 ENCOUNTER — Other Ambulatory Visit: Payer: Self-pay | Admitting: Gastroenterology

## 2019-06-01 NOTE — Telephone Encounter (Signed)
Patient called because her Pap smear released to My Chart and she noticed it is abnormal. I reassured her and explained ASCUS and that HR HPR is negative so likely Dr. Mariah Milling recommendation will be follow up Pap smear. I explained Dr. Marguerita Merles is off and when she does review that result she will send me a note and I will call patient again but in the meantime to be reassured.

## 2019-06-06 ENCOUNTER — Ambulatory Visit (INDEPENDENT_AMBULATORY_CARE_PROVIDER_SITE_OTHER): Payer: 59

## 2019-06-06 ENCOUNTER — Other Ambulatory Visit: Payer: Self-pay | Admitting: Internal Medicine

## 2019-06-06 DIAGNOSIS — Z1159 Encounter for screening for other viral diseases: Secondary | ICD-10-CM

## 2019-06-06 LAB — SARS CORONAVIRUS 2 (TAT 6-24 HRS): SARS Coronavirus 2: NEGATIVE

## 2019-06-08 ENCOUNTER — Other Ambulatory Visit: Payer: Self-pay

## 2019-06-08 ENCOUNTER — Ambulatory Visit (AMBULATORY_SURGERY_CENTER): Payer: 59 | Admitting: Internal Medicine

## 2019-06-08 ENCOUNTER — Encounter: Payer: Self-pay | Admitting: Internal Medicine

## 2019-06-08 VITALS — BP 138/96 | HR 83 | Temp 98.2°F | Resp 17 | Ht 61.0 in | Wt 119.0 lb

## 2019-06-08 DIAGNOSIS — K529 Noninfective gastroenteritis and colitis, unspecified: Secondary | ICD-10-CM

## 2019-06-08 DIAGNOSIS — K2970 Gastritis, unspecified, without bleeding: Secondary | ICD-10-CM | POA: Diagnosis not present

## 2019-06-08 DIAGNOSIS — K2990 Gastroduodenitis, unspecified, without bleeding: Secondary | ICD-10-CM

## 2019-06-08 DIAGNOSIS — D120 Benign neoplasm of cecum: Secondary | ICD-10-CM

## 2019-06-08 DIAGNOSIS — R112 Nausea with vomiting, unspecified: Secondary | ICD-10-CM

## 2019-06-08 DIAGNOSIS — K2950 Unspecified chronic gastritis without bleeding: Secondary | ICD-10-CM

## 2019-06-08 DIAGNOSIS — K3189 Other diseases of stomach and duodenum: Secondary | ICD-10-CM

## 2019-06-08 MED ORDER — SODIUM CHLORIDE 0.9 % IV SOLN: 500.0000 mL | INTRAVENOUS | Status: DC

## 2019-06-08 NOTE — Op Note (Signed)
[place] Patient Name: Shelly Edwards Procedure Date: 06/08/2019 7:57 AM MRN: [id number] Endoscopist: Gatha Mayer , MD Age: 48 Referring MD:  Date of Birth: Dec 19, 1970 Gender: Female Account #: [id number] Procedure:                Upper GI endoscopy Indications:              Diarrhea, Nausea with vomiting, Persistent vomiting Medicines:                Monitored Anesthesia Care Procedure:                Pre-Anesthesia Assessment:                           - Prior to the procedure, a History and Physical                            was performed, and patient medications and                            allergies were reviewed. The patient's tolerance of                            previous anesthesia was also reviewed. The risks                            and benefits of the procedure and the sedation                            options and risks were discussed with the patient.                            All questions were answered, and informed consent                            was obtained. Prior Anticoagulants: The patient has                            taken no previous anticoagulant or antiplatelet                            agents. ASA Grade Assessment: II - A patient with                            mild systemic disease. After reviewing the risks                            and benefits, the patient was deemed in                            satisfactory condition to undergo the procedure.                           After obtaining informed consent, the endoscope was  passed under direct vision. Throughout the                            procedure, the patient's blood pressure, pulse, and                            oxygen saturations were monitored continuously. The                            Endoscope was introduced through the mouth, and                            advanced to the second part of duodenum. The upper   GI endoscopy was accomplished without difficulty.                            The patient tolerated the procedure well. Scope In: Scope Out: Findings:                 The examined esophagus was normal.                           Patchy mild inflammation characterized by                            congestion (edema), erosions, erythema and                            granularity was found in the entire examined                            stomach. Biopsies were taken with a cold forceps                            for histology. Verification of patient                            identification for the specimen was done. Estimated                            blood loss was minimal.                           The examined duodenum was normal. Biopsies for                            histology were taken with a cold forceps for                            evaluation of celiac disease. Verification of                            patient identification for the specimen was done.  Estimated blood loss was minimal.                           The cardia and gastric fundus were normal on                            retroflexion. Complications:            No immediate complications. Estimated Blood Loss:     Estimated blood loss was minimal. Impression:               - Normal esophagus.                           - Gastritis. Biopsied.                           - Normal examined duodenum. Biopsied. Recommendation:           - Patient has a contact number available for                            emergencies. The signs and symptoms of potential                            delayed complications were discussed with the                            patient. Return to normal activities tomorrow.                            Written discharge instructions were provided to the                            patient.                           - Resume previous diet.                           - See the  other procedure note for documentation of                            additional recommendations.                           - Await pathology results. Gatha Mayer, MD 06/08/2019 8:48:15 AM This report has been signed electronically.

## 2019-06-08 NOTE — Patient Instructions (Addendum)
The stomach was mildly inflamed = gastritis - biopsies taken. Also took upper intestine biopsies looking for a possible cause of diarrhea.  One tiny (37mm) colon polyp removed - otherwise normal colonoscopy. Biopsies taken to look for microscopic inflammation (colitiis)  I will let you know next week likely.  I appreciate the opportunity to care for you. Gatha Mayer, MD, [facility]    Information on polyps given to you today.  Await pathology results.  YOU HAD AN ENDOSCOPIC PROCEDURE TODAY AT [facility]:   Refer to the procedure report that was given to you for any specific questions about what was found during the examination.  If the procedure report does not answer your questions, please call your gastroenterologist to clarify.  If you requested that your care partner not be given the details of your procedure findings, then the procedure report has been included in a sealed envelope for you to review at your convenience later.  YOU SHOULD EXPECT: Some feelings of bloating in the abdomen. Passage of more gas than usual.  Walking can help get rid of the air that was put into your GI tract during the procedure and reduce the bloating. If you had a lower endoscopy (such as a colonoscopy or flexible sigmoidoscopy) you may notice spotting of blood in your stool or on the toilet paper. If you underwent a bowel prep for your procedure, you may not have a normal bowel movement for a few days.  Please Note:  You might notice some irritation and congestion in your nose or some drainage.  This is from the oxygen used during your procedure.  There is no need for concern and it should clear up in a day or so.  SYMPTOMS TO REPORT IMMEDIATELY:   Following lower endoscopy (colonoscopy or flexible sigmoidoscopy):  Excessive amounts of blood in the stool  Significant tenderness or worsening of abdominal pains  Swelling of the abdomen that is new, acute  Fever of 100F or  higher   Following upper endoscopy (EGD)  Vomiting of blood or coffee ground material  New chest pain or pain under the shoulder blades  Painful or persistently difficult swallowing  New shortness of breath  Fever of 100F or higher  Black, tarry-looking stools  For urgent or emergent issues, a gastroenterologist can be reached at any hour by calling [phone].   DIET:  We do recommend a small meal at first, but then you may proceed to your regular diet.  Drink plenty of fluids but you should avoid alcoholic beverages for 24 hours.  ACTIVITY:  You should plan to take it easy for the rest of today and you should NOT DRIVE or use heavy machinery until tomorrow (because of the sedation medicines used during the test).    FOLLOW UP: Our staff will call the number listed on your records 48-72 hours following your procedure to check on you and address any questions or concerns that you may have regarding the information given to you following your procedure. If we do not reach you, we will leave a message.  We will attempt to reach you two times.  During this call, we will ask if you have developed any symptoms of COVID 19. If you develop any symptoms (ie: fever, flu-like symptoms, shortness of breath, cough etc.) before then, please call [phone].  If you test positive for Covid 19 in the 2 weeks post procedure, please call and report this information to [place].    If  any biopsies were taken you will be contacted by phone or by letter within the next 1-3 weeks.  Please call us at [phone] if you have not heard about the biopsies in 3 weeks.    SIGNATURES/CONFIDENTIALITY: You and/or your care partner have signed paperwork which will be entered into your electronic medical record.  These signatures attest to the fact that that the information above on your After Visit Summary has been reviewed and is understood.  Full responsibility of the confidentiality of this discharge information  lies with you and/or your care-partner.

## 2019-06-08 NOTE — Progress Notes (Signed)
PT taken to PACU. Monitors in place. VSS. Report given to RN. 

## 2019-06-08 NOTE — Progress Notes (Signed)
Temp JB V/s CW I have reviewed the patient's medical history in detail and updated the computerized patient record. 

## 2019-06-08 NOTE — Op Note (Signed)
[facility] Patient Name: Shelly Edwards Procedure Date: 06/08/2019 7:56 AM MRN: [id number] Endoscopist: Gatha Mayer , MD Age: 48 Referring MD:  Date of Birth: 12/25/1970 Gender: Female Account #: [id number] Procedure:                Colonoscopy Indications:              Chronic diarrhea Medicines:                Propofol per Anesthesia, Monitored Anesthesia Care Procedure:                Pre-Anesthesia Assessment:                           - Prior to the procedure, a History and Physical                            was performed, and patient medications and                            allergies were reviewed. The patient's tolerance of                            previous anesthesia was also reviewed. The risks                            and benefits of the procedure and the sedation                            options and risks were discussed with the patient.                            All questions were answered, and informed consent                            was obtained. Prior Anticoagulants: The patient has                            taken no previous anticoagulant or antiplatelet                            agents. ASA Grade Assessment: II - A patient with                            mild systemic disease. After reviewing the risks                            and benefits, the patient was deemed in                            satisfactory condition to undergo the procedure.                           After obtaining informed consent, the colonoscope  was passed under direct vision. Throughout the                            procedure, the patient's blood pressure, pulse, and                            oxygen saturations were monitored continuously. The                            Colonoscope was introduced through the anus and                            advanced to the the terminal ileum, with                            identification of the  appendiceal orifice and IC                            valve. The colonoscopy was performed without                            difficulty. The patient tolerated the procedure                            well. The quality of the bowel preparation was                            excellent. The terminal ileum, ileocecal valve,                            appendiceal orifice, and rectum were photographed.                            The bowel preparation used was Miralax via split                            dose instruction. Scope In: 8:14:22 AM Scope Out: 8:29:50 AM Scope Withdrawal Time: 0 hours 11 minutes 6 seconds  Total Procedure Duration: 0 hours 15 minutes 28 seconds  Findings:                 The perianal and digital rectal examinations were                            normal.                           The terminal ileum appeared normal.                           A 1 mm polyp was found in the cecum. The polyp was                            sessile. The polyp was removed with a cold biopsy  forceps. Resection and retrieval were complete.                            Verification of patient identification for the                            specimen was done. Estimated blood loss was minimal.                           The exam was otherwise without abnormality on                            direct and retroflexion views.                           Biopsies for histology were taken with a cold                            forceps from the ascending colon, transverse colon,                            descending colon and sigmoid colon for evaluation                            of microscopic colitis. Complications:            No immediate complications. Estimated Blood Loss:     Estimated blood loss was minimal. Impression:               - The examined portion of the ileum was normal.                           - One 1 mm polyp in the cecum, removed with a cold                             biopsy forceps. Resected and retrieved.                           - The examination was otherwise normal on direct                            and retroflexion views.                           - Biopsies were taken with a cold forceps from the                            ascending colon, transverse colon, descending colon                            and sigmoid colon for evaluation of microscopic                            colitis. Recommendation:           - Patient  has a contact number available for                            emergencies. The signs and symptoms of potential                            delayed complications were discussed with the                            patient. Return to normal activities tomorrow.                            Written discharge instructions were provided to the                            patient.                           - Resume previous diet.                           - Continue present medications.                           - Await pathology results.                           - See the other procedure note for documentation of                            additional recommendations. Gatha Mayer, MD 06/08/2019 8:50:34 AM This report has been signed electronically.

## 2019-06-08 NOTE — Progress Notes (Signed)
Called to room to assist during endoscopic procedure.  Patient ID and intended procedure confirmed with present staff. Received instructions for my participation in the procedure from the performing physician.  

## 2019-06-13 ENCOUNTER — Telehealth: Payer: Self-pay

## 2019-06-13 NOTE — Telephone Encounter (Signed)
  Follow up Call-  Call back number 06/08/2019  Post procedure Call Back phone  # [phone]  Permission to leave phone message Yes  Some recent data might be hidden     Patient questions:  Do you have a fever, pain , or abdominal swelling? No. Pain Score  0 *  Have you tolerated food without any problems? Yes.    Have you been able to return to your normal activities? Yes.    Do you have any questions about your discharge instructions: Diet   No. Medications  No. Follow up visit  No.  Do you have questions or concerns about your Care? No.  Actions: * If pain score is 4 or above: No action needed, pain <4.  1. Have you developed a fever since your procedure? no  2.   Have you had an respiratory symptoms (SOB or cough) since your procedure? no  3.   Have you tested positive for COVID 19 since your procedure no  4.   Have you had any family members/close contacts diagnosed with the COVID 19 since your procedure?  no   If yes to any of these questions please route to Joylene John, RN and Alphonsa Gin, Therapist, sports.

## 2019-06-14 ENCOUNTER — Encounter: Payer: Self-pay | Admitting: Internal Medicine

## 2019-06-14 ENCOUNTER — Telehealth: Payer: Self-pay | Admitting: Internal Medicine

## 2019-06-14 ENCOUNTER — Other Ambulatory Visit: Payer: Self-pay | Admitting: Internal Medicine

## 2019-06-14 DIAGNOSIS — Z860101 Personal history of adenomatous and serrated colon polyps: Secondary | ICD-10-CM | POA: Insufficient documentation

## 2019-06-14 DIAGNOSIS — Z86010 Personal history of colonic polyps: Secondary | ICD-10-CM

## 2019-06-14 HISTORY — DX: Personal history of colonic polyps: Z86.010

## 2019-06-14 HISTORY — DX: Personal history of adenomatous and serrated colon polyps: Z86.0101

## 2019-06-14 MED ORDER — ALOSETRON HCL 0.5 MG PO TABS: 0.5000 mg | ORAL_TABLET | Freq: Two times a day (BID) | ORAL | 0 refills | Status: DC

## 2019-06-14 NOTE — Telephone Encounter (Signed)
Reviewed colonoscopy and EGD results - all bxs ok w/o inflammation Had 1 mm adenoma  Plan is to try alosetron for suspected IBS  Will send additional My Chart message to patient  Advised no concomitant anti-diarrheals and stop if severe constipation/abdominal pain occur

## 2019-06-14 NOTE — Progress Notes (Signed)
I called her w/ results - main finding is1 mm adenoma Other biopsies ok  Needs colonoscopy recall 05/2026  I have sent alosetron Rx

## 2019-07-07 ENCOUNTER — Other Ambulatory Visit: Payer: Self-pay

## 2019-07-07 MED ORDER — ALOSETRON HCL 0.5 MG PO TABS: 0.5000 mg | ORAL_TABLET | Freq: Two times a day (BID) | ORAL | 0 refills | Status: DC

## 2019-07-07 NOTE — Telephone Encounter (Signed)
Alosetron refilled.

## 2019-08-12 ENCOUNTER — Other Ambulatory Visit: Payer: Self-pay | Admitting: Internal Medicine

## 2019-11-11 ENCOUNTER — Other Ambulatory Visit: Payer: Self-pay | Admitting: Internal Medicine

## 2019-11-29 ENCOUNTER — Other Ambulatory Visit: Payer: Self-pay

## 2019-11-30 ENCOUNTER — Ambulatory Visit (INDEPENDENT_AMBULATORY_CARE_PROVIDER_SITE_OTHER): Payer: 59 | Admitting: Family Medicine

## 2019-11-30 ENCOUNTER — Encounter: Payer: Self-pay | Admitting: Family Medicine

## 2019-11-30 VITALS — BP 120/86 | HR 85 | Temp 97.6°F | Resp 12 | Ht 61.0 in | Wt 113.2 lb

## 2019-11-30 DIAGNOSIS — F1020 Alcohol dependence, uncomplicated: Secondary | ICD-10-CM | POA: Diagnosis not present

## 2019-11-30 DIAGNOSIS — R7401 Elevation of levels of liver transaminase levels: Secondary | ICD-10-CM

## 2019-11-30 DIAGNOSIS — E538 Deficiency of other specified B group vitamins: Secondary | ICD-10-CM | POA: Diagnosis not present

## 2019-11-30 DIAGNOSIS — E559 Vitamin D deficiency, unspecified: Secondary | ICD-10-CM

## 2019-11-30 MED ORDER — THIAMINE HCL 100 MG PO TABS: 100.0000 mg | ORAL_TABLET | Freq: Every day | ORAL | 3 refills | Status: AC

## 2019-11-30 NOTE — Patient Instructions (Addendum)
A few things to remember from today's visit:  Celexa 10 mg daily started today. I will see you back in 3-4 weeks. Thiamine 100 mg daily.   Alcohol Abuse and Dependence Information, Adult Alcohol is a widely available drug. People drink alcohol in different amounts. People who drink alcohol very often and in large amounts often have problems during and after drinking. They may develop what is called an alcohol use disorder. There are two main types of alcohol use disorders:  Alcohol abuse. This is when you use alcohol too much or too often. You may use alcohol to make yourself feel happy or to reduce stress. You may have a hard time setting a limit on the amount you drink.  Alcohol dependence. This is when you use alcohol consistently for a period of time, and your body changes as a result. This can make it hard to stop drinking because you may start to feel sick or feel different when you do not use alcohol. These symptoms are known as withdrawal. How can alcohol abuse and dependence affect me? Alcohol abuse and dependence can have a negative effect on your life. Drinking too much can lead to addiction. You may feel like you need alcohol to function normally. You may drink alcohol before work in the morning, during the day, or as soon as you get home from work in the evening. These actions can result in:  Poor work performance.  Job loss.  Financial problems.  Car crashes or criminal charges from driving after drinking alcohol.  Problems in your relationships with friends and family.  Losing the trust and respect of coworkers, friends, and family. Drinking heavily over a long period of time can permanently damage your body and brain, and can cause lifelong health issues, such as:  Damage to your liver or pancreas.  Heart problems, high blood pressure, or stroke.  Certain cancers.  Decreased ability to fight infections.  Brain or nerve damage.  Depression.  Early (premature)  death. If you are careless or you crave alcohol, it is easy to drink more than your body can handle (overdose). Alcohol overdose is a serious situation that requires hospitalization. It may lead to permanent injuries or death. What can increase my risk?  Having a family history of alcohol abuse.  Having depression or other mental health conditions.  Beginning to drink at an early age.  Binge drinking often.  Experiencing trauma, stress, and an unstable home life during childhood.  Spending time with people who drink often. What actions can I take to prevent or manage alcohol abuse and dependence?  Do not drink alcohol if: ? Your health care provider tells you not to drink. ? You are pregnant, may be pregnant, or are planning to become pregnant.  If you drink alcohol: ? Limit how much you use to:  0-1 drink a day for women.  0-2 drinks a day for men. ? Be aware of how much alcohol is in your drink. In the [place] one drink equals one 12 oz bottle of beer (355 mL), one 5 oz glass of wine (148 mL), or one 1 oz glass of hard liquor (44 mL).  Stop drinking if you have been drinking too much. This can be very hard to do if you are used to abusing alcohol. If you begin to have withdrawal symptoms, talk with your health care provider or a person that you trust. These symptoms may include anxiety, shaky hands, headache, nausea, sweating, or not being able to  sleep.  Choose to drink nonalcoholic beverages in social gatherings and places where there may be alcohol. Activity  Spend more time on activities that you enjoy that do not involve alcohol, like hobbies or exercise.  Find healthy ways to cope with stress, such as exercise, meditation, or spending time with people you care about. General information  Talk to your family, coworkers, and friends about supporting you in your efforts to stop drinking. If they drink, ask them not to drink around you. Spend more time with people who do not  drink alcohol.  If you think that you have an alcohol dependency problem: ? Tell friends or family about your concerns. ? Talk with your health care provider or another health professional about where to get help. ? Work with a Transport planner and a Regulatory affairs officer. ? Consider joining a support group for people who struggle with alcohol abuse and dependence. Where to find support   Your health care provider.  SMART Recovery: www.smartrecovery.org Therapy and support groups  Local treatment centers or chemical dependency counselors.  Local AA groups in your community: NicTax.com.pt Where to find more information  Centers for Disease Control and Prevention: http://www.wolf.info/  National Institute on Alcohol Abuse and Alcoholism: http://www.bradshaw.com/  Alcoholics Anonymous (AA): NicTax.com.pt Contact a health care provider if:  You drank more or for longer than you intended on more than one occasion.  You tried to stop drinking or to cut back on how much you drink, but you were not able to.  You often drink to the point of vomiting or passing out.  You want to drink so badly that you cannot think about anything else.  You have problems in your life due to drinking, but you continue to drink.  You keep drinking even though you feel anxious, depressed, or have experienced memory loss.  You have stopped doing the things you used to enjoy in order to drink.  You have to drink more than you used to in order to get the effect you want.  You experience anxiety, sweating, nausea, shakiness, and trouble sleeping when you try to stop drinking. Get help right away if:  You have thoughts about hurting yourself or others.  You have serious withdrawal symptoms, including: ? Confusion. ? Racing heart. ? High blood pressure. ? Fever. If you ever feel like you may hurt yourself or others, or have thoughts about taking your own life, get help right away. You can go to your nearest emergency  department or call:  Your local emergency services (911 in the [place]  A suicide crisis helpline, such as the [facility] at 401-732-2097. This is open 24 hours a day. Summary  Alcohol abuse and dependence can have a negative effect on your life. Drinking too much or too often can lead to addiction.  If you drink alcohol, limit how much you use.  If you are having trouble keeping your drinking under control, find ways to change your behavior. Hobbies, calming activities, exercise, or support groups can help.  If you feel you need help with changing your drinking habits, talk with your health care provider, a good friend, or a therapist, or go to an [facility] group. This information is not intended to replace advice given to you by your health care provider. Make sure you discuss any questions you have with your health care provider. Document Revised: 09/14/2018 Document Reviewed: 08/03/2018 Elsevier Patient Education  [employer].  If you need refills please call your  pharmacy. Do not use My Chart to request refills or for acute issues that need immediate attention.    Please be sure medication list is accurate. If a new problem present, please set up appointment sooner than planned today.

## 2019-11-30 NOTE — Progress Notes (Signed)
Chief Complaint  Patient presents with  . Bruising on arms    bruising on arms getting worse   HPI: Shelly Edwards is a 49 y.o. female, who is here today with above complaint. Easy bruising and skin tear with minor trauma. Problem is worse on upper extremities. She has not noted frequent nose/gum bleed,gross hematuria,hematochezia,or melena. Occasional vaginal spotting, which has been addressed by her gyn. LMP 2 weeks ago. No blood clots.  She has had some easy bruising in the past but greatly worse for the past few months. She is not taking Aspirin or frequent NSAID's.  Hx of high alcohol intake and recent abnormal LFT's. COVID 19 pandemia aggravated problem. She is working from home and stays by herself most of the time. Starts drinking beer first thing in the morning. 5-6 beers daily, slowly through the day, she usually does not get drunk.  She has lost wt, decreased food intake.  Lab Results  Component Value Date   ALT 36 (H) 03/11/2019   AST 64 (H) 03/11/2019   ALKPHOS 64 03/11/2019   BILITOT 0.5 03/11/2019   Lab Results  Component Value Date   CREATININE 0.83 03/11/2019   BUN 10 03/11/2019   NA 136 03/11/2019   K 4.2 03/11/2019   CL 103 03/11/2019   CO2 23 03/11/2019   Lab Results  Component Value Date   WBC 5.4 03/11/2019   HGB 13.8 03/11/2019   HCT 40.2 03/11/2019   MCV 96.8 03/11/2019   PLT 245.0 03/11/2019   No significant hx of depression or anxiety but frustrated because she understands she has a problem with alcohol but not able to stop it. 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  Negative for illicit drug use. Former smoker. She has thought about being better off dead but has not have ideation or attempt  Review of Systems  Constitutional: Positive for appetite change and fatigue. Negative for activity change and fever.    HENT: Negative for mouth sores, nosebleeds and sore throat.   Respiratory: Negative for cough, shortness of breath and wheezing.   Cardiovascular: Negative for chest pain, palpitations and leg swelling.  Gastrointestinal: Negative for abdominal pain, nausea and vomiting.       Negative for changes in bowel habits.  Genitourinary: Negative for decreased urine volume and dysuria.  Musculoskeletal: Negative for gait problem and myalgias.  Neurological: Negative for syncope and weakness.  Psychiatric/Behavioral: Positive for sleep disturbance. Negative for confusion.  Rest see pertinent positives and negatives per HPI.  Current Outpatient Medications on File Prior to Visit  Medication Sig Dispense Refill  . ADVAIR DISKUS 100-50 MCG/DOSE AEPB Inhale 1 puff into the lungs 2 (two) times daily.     . Calcium Carb-Cholecalciferol (CALCIUM 600+D3 PO) Take by mouth.    . Cholecalciferol (VITAMIN D PO) Take 1 tablet by mouth daily.    . clobetasol (OLUX) 0.05 % topical foam APPLY TO AFFECTED AREAS OF BODY (NOT FACE) 1 2 TIMES DAILY AS NEEDED    . diphenhydrAMINE (BENADRYL) 25 MG tablet Take 25 mg by mouth as needed.    . fluticasone (FLONASE) 50 MCG/ACT nasal spray Place into both nostrils daily.    . ondansetron (ZOFRAN-ODT) 8 MG disintegrating tablet TAKE 1 TABLET BY MOUTH EVERY 8 HOURS AS NEEDED FOR NAUSEA, VOMITING OR REFRACTORY NAUSEA / VOMITING. 9 tablet 2  .  SUMAtriptan (IMITREX) 50 MG tablet Take 1 tablet (50 mg total) by mouth daily as needed for migraine. May repeat in 2 hours if headache persists or recurs. 10 tablet 1  . VENTOLIN HFA 108 (90 BASE) MCG/ACT inhaler Inhale 1-2 puffs into the lungs as needed. Reported on 05/29/2015     No current facility-administered medications on file prior to visit.     Past Medical History:  Diagnosis Date  . Allergic rhinitis   . Allergy   . Asthma   . Hx of adenomatous polyp of colon 06/14/2019   Allergies  Allergen Reactions  . Molds & Smuts  Itching and Shortness Of Breath    Social History   Socioeconomic History  . Marital status: Single    Spouse name: Not on file  . Number of children: 2  . Years of education: Not on file  . Highest education level: Not on file  Occupational History  . 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[employer]  Tobacco Use  . Smoking status: Former Smoker    Quit date: 05/28/1987    Years since quitting: 32.5  . Smokeless tobacco: Never Used  Vaping Use  . Vaping Use: Never used  Substance and Sexual Activity  . Alcohol use: Yes    Alcohol/week: 0.0 standard drinks    Comment: occ  . Drug use: No  . Sexual activity: Yes    Comment: patients husband with vasectomy  Other Topics Concern  . 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 Difficulty of Paying Living Expenses:   Food Insecurity:   . Worried About Charity fundraiser in the Last Year:   . Arboriculturist in the Last Year:   Transportation Needs:   . Film/video editor (Medical):   Marland Kitchen Lack of Transportation (Non-Medical):   Physical Activity:   . Days of Exercise per Week:   . Minutes of Exercise per Session:   Stress:   . Feeling of Stress :   Social Connections:   . Frequency of Communication with Friends and Family:   . Frequency of Social Gatherings with Friends and Family:   . Attends Religious Services:   . Active Member of Clubs or Organizations:   . Attends Archivist Meetings:   Marland Kitchen Marital Status:     Vitals:   11/30/19 1453  BP: 120/86  Pulse: 85  Resp: 12  Temp: 97.6 F (36.4 C)  SpO2: 99%   Wt Readings from Last 3 Encounters:  11/30/19 113 lb 4 oz (51.4 kg)  06/08/19 119 lb (54 kg)  05/26/19 116 lb (52.6 kg)   Body mass index is 21.4 kg/m.  Physical Exam Constitutional:      General: She is not in acute distress.    Appearance:  She is well-developed and normal weight.  HENT:     Head: Normocephalic and atraumatic.     Mouth/Throat:     Mouth: Mucous membranes are dry.  Eyes:     Conjunctiva/sclera: Conjunctivae normal.     Pupils: Pupils are equal, round, and reactive to light.  Cardiovascular:     Rate and Rhythm: Normal rate and regular rhythm.     Heart sounds: No murmur heard.   Pulmonary:     Effort: Pulmonary effort is normal.  Breath sounds: Normal breath sounds. No wheezing or rales.  Chest:     Chest wall: No tenderness.  Abdominal:     Palpations: Abdomen is soft. There is no fluid wave, hepatomegaly or mass.     Tenderness: There is no abdominal tenderness.  Musculoskeletal:     Cervical back: Neck supple.  Lymphadenopathy:     Cervical: No cervical adenopathy.  Skin:    General: Skin is warm.     Coloration: Skin is not jaundiced.     Findings: Ecchymosis (Most scattered on UE's, a few on LE's.) and laceration (skin tear healing on right forearm) present. No erythema.  Neurological:     General: No focal deficit present.     Mental Status: [name], place, and time.     Motor: Tremor (Hands, mild) present.     Coordination: Coordination normal.     Gait: Gait normal.  Psychiatric:        Mood and Affect: Mood is anxious. Affect is labile.        Speech: Speech normal.    ASSESSMENT AND PLAN:  Shelly Edwards was seen today for bruising on arms.  Diagnoses and all orders for this visit: Orders Placed This Encounter  Procedures  . Basic metabolic panel  . Hepatic function panel  . CBC with Differential/Platelet  . Protime-INR  . VITAMIN D 25 Hydroxy (Vit-D Deficiency, Fractures)  . Vitamin B12   Lab Results  Component Value Date   [id number] 11/30/2019   Lab Results  Component Value Date   WBC 4.7 11/30/2019   HGB 12.5 11/30/2019   HCT 37.1 11/30/2019   MCV 99.1 11/30/2019   PLT 216.0 11/30/2019   Lab Results  Component Value Date    ALT 33 11/30/2019   AST 85 (H) 11/30/2019   ALKPHOS 81 11/30/2019   BILITOT 0.4 11/30/2019    Elevated transaminase level AST>ALT, alcohol related. We discussed possible complications of high alcohol intake. Further recommendations according to lab results.  Alcohol use disorder, moderate, dependence ([place]) She does not think AA will help. Associated depressed mood (PHQ 10), she agrees with trying SSRI, Celexa 10 mg daily. We discussed some side effects.  In regard to pharmacologic treatment for alcohol use disorder, Naltrexone can not be used because abnormal LFT's+still drinking. Acamposa (low dose due to wt) is a better option, no hx of renal disease. We will discuss these options next visit.  -     thiamine 100 MG tablet; Take 1 tablet (100 mg total) by mouth daily.  Vitamin D deficiency, unspecified Continue current dose of vit D supplementation. Further recommendations according to lab results.  B12 deficiency Recommendations will be given according to B12 results.   Return in about 4 weeks (around 12/28/2019) for f/u.   Soua Caltagirone G. [place], MD  [facility]. [place] office.  A few things to remember from today's visit:  Celexa 10 mg daily started today. I will see you back in 3-4 weeks. Thiamine 100 mg daily.   Alcohol Abuse and Dependence Information, Adult Alcohol is a widely available drug. People drink alcohol in different amounts. People who drink alcohol very often and in large amounts often have problems during and after drinking. They may develop what is called an alcohol use disorder. There are two main types of alcohol use disorders:  Alcohol abuse. This is when you use alcohol too much or too often. You may use alcohol to make yourself feel happy  or to reduce stress. You may have a hard time setting a limit on the amount you drink.  Alcohol dependence. This is when you use alcohol consistently for a period of time, and your body changes as a  result. This can make it hard to stop drinking because you may start to feel sick or feel different when you do not use alcohol. These symptoms are known as withdrawal. How can alcohol abuse and dependence affect me? Alcohol abuse and dependence can have a negative effect on your life. Drinking too much can lead to addiction. You may feel like you need alcohol to function normally. You may drink alcohol before work in the morning, during the day, or as soon as you get home from work in the evening. These actions can result in:  Poor work performance.  Job loss.  Financial problems.  Car crashes or criminal charges from driving after drinking alcohol.  Problems in your relationships with friends and family.  Losing the trust and respect of coworkers, friends, and family. Drinking heavily over a long period of time can permanently damage your body and brain, and can cause lifelong health issues, such as:  Damage to your liver or pancreas.  Heart problems, high blood pressure, or stroke.  Certain cancers.  Decreased ability to fight infections.  Brain or nerve damage.  Depression.  Early (premature) death. If you are careless or you crave alcohol, it is easy to drink more than your body can handle (overdose). Alcohol overdose is a serious situation that requires hospitalization. It may lead to permanent injuries or death. What can increase my risk?  Having a family history of alcohol abuse.  Having depression or other mental health conditions.  Beginning to drink at an early age.  Binge drinking often.  Experiencing trauma, stress, and an unstable home life during childhood.  Spending time with people who drink often. What actions can I take to prevent or manage alcohol abuse and dependence?  Do not drink alcohol if: ? Your health care provider tells you not to drink. ? You are pregnant, may be pregnant, or are planning to become pregnant.  If you drink  alcohol: ? Limit how much you use to:  0-1 drink a day for women.  0-2 drinks a day for men. ? Be aware of how much alcohol is in your drink. In the [place] one drink equals one 12 oz bottle of beer (355 mL), one 5 oz glass of wine (148 mL), or one 1 oz glass of hard liquor (44 mL).  Stop drinking if you have been drinking too much. This can be very hard to do if you are used to abusing alcohol. If you begin to have withdrawal symptoms, talk with your health care provider or a person that you trust. These symptoms may include anxiety, shaky hands, headache, nausea, sweating, or not being able to sleep.  Choose to drink nonalcoholic beverages in social gatherings and places where there may be alcohol. Activity  Spend more time on activities that you enjoy that do not involve alcohol, like hobbies or exercise.  Find healthy ways to cope with stress, such as exercise, meditation, or spending time with people you care about. General information  Talk to your family, coworkers, and friends about supporting you in your efforts to stop drinking. If they drink, ask them not to drink around you. Spend more time with people who do not drink alcohol.  If you think that you have an alcohol dependency  problem: ? Tell friends or family about your concerns. ? Talk with your health care provider or another health professional about where to get help. ? Work with a Transport planner and a Regulatory affairs officer. ? Consider joining a support group for people who struggle with alcohol abuse and dependence. Where to find support   Your health care provider.  SMART Recovery: www.smartrecovery.org Therapy and support groups  Local treatment centers or chemical dependency counselors.  Local AA groups in your community: NicTax.com.pt Where to find more information  Centers for Disease Control and Prevention: http://www.wolf.info/  National Institute on Alcohol Abuse and Alcoholism: http://www.bradshaw.com/  Alcoholics  Anonymous (AA): NicTax.com.pt Contact a health care provider if:  You drank more or for longer than you intended on more than one occasion.  You tried to stop drinking or to cut back on how much you drink, but you were not able to.  You often drink to the point of vomiting or passing out.  You want to drink so badly that you cannot think about anything else.  You have problems in your life due to drinking, but you continue to drink.  You keep drinking even though you feel anxious, depressed, or have experienced memory loss.  You have stopped doing the things you used to enjoy in order to drink.  You have to drink more than you used to in order to get the effect you want.  You experience anxiety, sweating, nausea, shakiness, and trouble sleeping when you try to stop drinking. Get help right away if:  You have thoughts about hurting yourself or others.  You have serious withdrawal symptoms, including: ? Confusion. ? Racing heart. ? High blood pressure. ? Fever. If you ever feel like you may hurt yourself or others, or have thoughts about taking your own life, get help right away. You can go to your nearest emergency department or call:  Your local emergency services (911 in the [place]  A suicide crisis helpline, such as the [place] at (250) 036-8264. This is open 24 hours a day. Summary  Alcohol abuse and dependence can have a negative effect on your life. Drinking too much or too often can lead to addiction.  If you drink alcohol, limit how much you use.  If you are having trouble keeping your drinking under control, find ways to change your behavior. Hobbies, calming activities, exercise, or support groups can help.  If you feel you need help with changing your drinking habits, talk with your health care provider, a good friend, or a therapist, or go to an [place] group. This information is not intended to replace advice given to you by your health care  provider. Make sure you discuss any questions you have with your health care provider. Document Revised: 09/14/2018 Document Reviewed: 08/03/2018 Elsevier Patient Education  [employer].  If you need refills please call your pharmacy. Do not use My Chart to request refills or for acute issues that need immediate attention.    Please be sure medication list is accurate. If a new problem present, please set up appointment sooner than planned today.

## 2019-12-01 LAB — PROTIME-INR
INR: 1 ratio (ref 0.8–1.0)
Prothrombin Time: 11.3 s (ref 9.6–13.1)

## 2019-12-01 LAB — CBC WITH DIFFERENTIAL/PLATELET
Basophils Absolute: 0.1 10*3/uL (ref 0.0–0.1)
Basophils Relative: 1.3 % (ref 0.0–3.0)
Eosinophils Absolute: 0.1 10*3/uL (ref 0.0–0.7)
Eosinophils Relative: 1.7 % (ref 0.0–5.0)
HCT: 37.1 % (ref 36.0–46.0)
Hemoglobin: 12.5 g/dL (ref 12.0–15.0)
Lymphocytes Relative: 32.4 % (ref 12.0–46.0)
Lymphs Abs: 1.5 10*3/uL (ref 0.7–4.0)
MCHC: 33.8 g/dL (ref 30.0–36.0)
MCV: 99.1 fl (ref 78.0–100.0)
Monocytes Absolute: 0.4 10*3/uL (ref 0.1–1.0)
Monocytes Relative: 9 % (ref 3.0–12.0)
Neutro Abs: 2.6 10*3/uL (ref 1.4–7.7)
Neutrophils Relative %: 55.6 % (ref 43.0–77.0)
Platelets: 216 10*3/uL (ref 150.0–400.0)
RBC: 3.75 Mil/uL — ABNORMAL LOW (ref 3.87–5.11)
RDW: 14.9 % (ref 11.5–15.5)
WBC: 4.7 10*3/uL (ref 4.0–10.5)

## 2019-12-01 LAB — BASIC METABOLIC PANEL
BUN: 9 mg/dL (ref 6–23)
CO2: 26 mEq/L (ref 19–32)
Calcium: 8.8 mg/dL (ref 8.4–10.5)
Chloride: 101 mEq/L (ref 96–112)
Creatinine, Ser: 0.66 mg/dL (ref 0.40–1.20)
GFR: 94.96 mL/min (ref >60.00)
Glucose, Bld: 87 mg/dL (ref 70–99)
Potassium: 4.2 mEq/L (ref 3.5–5.1)
Sodium: 136 mEq/L (ref 135–145)

## 2019-12-01 LAB — HEPATIC FUNCTION PANEL
ALT: 33 U/L (ref 0–35)
AST: 85 U/L — ABNORMAL HIGH (ref 0–37)
Albumin: 3.5 g/dL (ref 3.5–5.2)
Alkaline Phosphatase: 81 U/L (ref 39–117)
Bilirubin, Direct: 0.4 mg/dL — ABNORMAL HIGH (ref 0.0–0.3)
Total Bilirubin: 0.4 mg/dL (ref 0.2–1.2)
Total Protein: 6 g/dL (ref 6.0–8.3)

## 2019-12-01 LAB — VITAMIN B12: Vitamin B-12: 564 pg/mL (ref 211–911)

## 2019-12-01 LAB — VITAMIN D 25 HYDROXY (VIT D DEFICIENCY, FRACTURES): VITD: 36.91 ng/mL (ref 30.00–100.00)

## 2019-12-04 MED ORDER — CITALOPRAM HYDROBROMIDE 10 MG PO TABS: 10.0000 mg | ORAL_TABLET | Freq: Every day | ORAL | 1 refills | Status: DC

## 2020-01-03 ENCOUNTER — Other Ambulatory Visit: Payer: Self-pay | Admitting: Internal Medicine

## 2020-01-04 ENCOUNTER — Other Ambulatory Visit: Payer: Self-pay

## 2020-01-04 ENCOUNTER — Ambulatory Visit (INDEPENDENT_AMBULATORY_CARE_PROVIDER_SITE_OTHER): Payer: 59 | Admitting: Family Medicine

## 2020-01-04 ENCOUNTER — Encounter: Payer: Self-pay | Admitting: Family Medicine

## 2020-01-04 VITALS — BP 120/70 | HR 90 | Temp 98.2°F | Resp 12 | Ht 61.0 in | Wt 111.2 lb

## 2020-01-04 DIAGNOSIS — Z130 Encounter for screening for diseases of the blood and blood-forming organs and certain disorders involving the immune mechanism: Secondary | ICD-10-CM

## 2020-01-04 DIAGNOSIS — E781 Pure hyperglyceridemia: Secondary | ICD-10-CM | POA: Diagnosis not present

## 2020-01-04 DIAGNOSIS — Z1329 Encounter for screening for other suspected endocrine disorder: Secondary | ICD-10-CM | POA: Diagnosis not present

## 2020-01-04 DIAGNOSIS — Z23 Encounter for immunization: Secondary | ICD-10-CM | POA: Diagnosis not present

## 2020-01-04 DIAGNOSIS — F1020 Alcohol dependence, uncomplicated: Secondary | ICD-10-CM | POA: Diagnosis not present

## 2020-01-04 DIAGNOSIS — Z0000 Encounter for general adult medical examination without abnormal findings: Secondary | ICD-10-CM

## 2020-01-04 DIAGNOSIS — E785 Hyperlipidemia, unspecified: Secondary | ICD-10-CM | POA: Insufficient documentation

## 2020-01-04 DIAGNOSIS — Z13228 Encounter for screening for other metabolic disorders: Secondary | ICD-10-CM

## 2020-01-04 MED ORDER — ACAMPROSATE CALCIUM 333 MG PO TBEC: 333.0000 mg | DELAYED_RELEASE_TABLET | Freq: Two times a day (BID) | ORAL | 1 refills | Status: DC

## 2020-01-04 NOTE — Patient Instructions (Addendum)
Today you have you routine preventive visit. A few things to remember from today's visit:   Routine adult health maintenance  Screening for endocrine, metabolic and immunity disorder  Alcohol use disorder, moderate, dependence ([place]) - Plan: acamprosate (CAMPRAL) 333 MG tablet  Start taking med once daily and increase to 2 tabs if well tolerated. Will check cholesterol next visit.  If you need refills please call your pharmacy. Do not use My Chart to request refills or for acute issues that need immediate attention.    Please be sure medication list is accurate. If a new problem present, please set up appointment sooner than planned today.  At least 150 minutes of moderate exercise per week, daily brisk walking for 15-30 min is a good exercise option. Healthy diet low in saturated (animal) fats and sweets and consisting of fresh fruits and vegetables, lean meats such as fish and white chicken and whole grains.  These are some of recommendations for screening depending of age and risk factors:  - Vaccines:  Tdap vaccine every 10 years.Given today.  Shingles vaccine recommended at age 56, could be given after 50 years of age but not sure about insurance coverage.   Pneumonia vaccines: Pneumovax at 55. Sometimes Pneumovax is giving earlier if history of smoking, lung disease,diabetes,kidney disease among some. Given today.  Screening for diabetes at age 56 and every 3 years.  Cervical cancer prevention:  Pap smear starts at 50 years of age and continues periodically until 50 years old in low risk women. Pap smear every 3 years between 14 and 27 years old. Pap smear every 3-5 years between women 44 and older if pap smear negative and HPV screening negative.   -Breast cancer: Mammogram: There is disagreement between experts about when to start screening in low risk asymptomatic female but recent recommendations are to start screening at 56 and not later than 50 years old , every  1-2 years and after 50 yo q 2 years. Screening is recommended until 50 years old but some women can continue screening depending of healthy issues.  Colon cancer screening: Has been recently changed to 50 yo. Insurance may not cover until you are 50 years old. Screening is recommended until 50 years old.  Cholesterol disorder screening at age 31 and every 3 years.N/A  Also recommended:  1. Dental visit- Brush and floss your teeth twice daily; visit your dentist twice a year. 2. Eye doctor- Get an eye exam at least every 2 years. 3. Helmet use- Always wear a helmet when riding a bicycle, motorcycle, rollerblading or skateboarding. 4. Safe sex- If you may be exposed to sexually transmitted infections, use a condom. 5. Seat belts- Seat belts can save your live; always wear one. 6. Smoke/Carbon Monoxide detectors- These detectors need to be installed on the appropriate level of your home. Replace batteries at least once a year. 7. Skin cancer- When out in the sun please cover up and use sunscreen 15 SPF or higher. 8. Violence- If anyone is threatening or hurting you, please tell your healthcare provider.  9. Drink alcohol in moderation- Limit alcohol intake to one drink or less per day. Never drink and drive. 10. Calcium supplementation 1000 to 1200 mg daily, ideally through your diet.  Vitamin D supplementation 800 units daily.

## 2020-01-04 NOTE — Progress Notes (Signed)
HPI: [name] is a 49 y.o. female, who is here today for her routine physical.  Last CPE: > a year ago. Last gyn exam 05/26/19.  Regular exercise 3 or more time per week: Walking daily,10,000-15,000 steps. Following a healthy diet: Yes. She lives alone.  Chronic medical problems: Vit D deficiency  Pap smear: 05/26/19  Immunization History  Administered Date(s) Administered   Influenza Inj Mdck Quad Pf 03/10/2019   Influenza-Unspecified 03/04/2017   PFIZER SARS-COV-2 Vaccination 08/30/2019, 09/20/2019   Pneumococcal Polysaccharide-23 01/04/2020   Tdap 01/04/2020    Mammogram: 07/21/18. Colonoscopy: 06/08/19. DEXA: N/A  Hep C screening: Never.  Alcohol use disorder: She has not decreased alcohol intake. Elevated transaminases.  Lab Results  Component Value Date   ALT 33 11/30/2019   AST 85 (H) 11/30/2019   ALKPHOS 81 11/30/2019   BILITOT 0.4 11/30/2019   She has been interested in pharmacologic treatment. She doe snot think AA will help.  Last visit, 11/30/19, Citalopram 10 mg added to treat possible depression, she started med 2 weeks ago. She has not noted side effects, no changes in mood. She is still working from home, not sure when she will be back to her office.  HLD: She is on non pharmacologic treatment. Lab Results  Component Value Date   CHOL 174 05/27/2018   HDL 73 05/27/2018   LDLCALC 54 05/27/2018   TRIG 386 (H) 05/27/2018   CHOLHDL 2.4 05/27/2018   Review of Systems  Constitutional: Positive for fatigue. Negative for appetite change and fever.  HENT: Negative for dental problem, hearing loss, mouth sores, sore throat and trouble swallowing.   Eyes: Negative for redness and visual disturbance.  Respiratory: Negative for cough, shortness of breath and wheezing.   Cardiovascular: Negative for chest pain, palpitations and leg swelling.  Gastrointestinal: Negative for abdominal pain, nausea and vomiting.       No  changes in bowel habits.  Endocrine: Negative for cold intolerance, heat intolerance, polydipsia, polyphagia and polyuria.  Genitourinary: Negative for decreased urine volume, dysuria, hematuria, vaginal bleeding and vaginal discharge.  Musculoskeletal: Negative for gait problem and myalgias.  Skin: Negative for color change and rash.  Allergic/Immunologic: Positive for environmental allergies.  Neurological: Negative for syncope, weakness and headaches.  Hematological: Negative for adenopathy. Bruises/bleeds easily.  Psychiatric/Behavioral: Negative for confusion and hallucinations.  All other systems reviewed and are negative.  Current Outpatient Medications on File Prior to Visit  Medication Sig Dispense Refill   ADVAIR DISKUS 100-50 MCG/DOSE AEPB Inhale 1 puff into the lungs 2 (two) times daily.      Calcium Carb-Cholecalciferol (CALCIUM 600+D3 PO) Take by mouth.     Cholecalciferol (VITAMIN D PO) Take 1 tablet by mouth daily.     citalopram (CELEXA) 10 MG tablet Take 1 tablet (10 mg total) by mouth daily. 30 tablet 1   clobetasol (OLUX) 0.05 % topical foam APPLY TO AFFECTED AREAS OF BODY (NOT FACE) 1 2 TIMES DAILY AS NEEDED     diphenhydrAMINE (BENADRYL) 25 MG tablet Take 25 mg by mouth as needed.     fluticasone (FLONASE) 50 MCG/ACT nasal spray Place into both nostrils daily.     ondansetron (ZOFRAN-ODT) 8 MG disintegrating tablet TAKE 1 TABLET BY MOUTH EVERY 8 HOURS AS NEEDED FOR NAUSEA, VOMITING OR REFRACTORY NAUSEA / VOMITING. 20 tablet 0   SUMAtriptan (IMITREX) 50 MG tablet Take 1 tablet (50 mg total) by mouth daily as needed for migraine. May repeat in 2 hours  if headache persists or recurs. 10 tablet 1   thiamine 100 MG tablet Take 1 tablet (100 mg total) by mouth daily. 90 tablet 3   VENTOLIN HFA 108 (90 BASE) MCG/ACT inhaler Inhale 1-2 puffs into the lungs as needed. Reported on 05/29/2015     No current facility-administered medications on file prior to visit.      Past Medical History:  Diagnosis Date   Allergic rhinitis    Allergy    Asthma    Hx of adenomatous polyp of colon 06/14/2019    Past Surgical History:  Procedure Laterality Date   AUGMENTATION MAMMAPLASTY     COLONOSCOPY  8119   UMBILICAL HERNIA REPAIR  2008   VAGINAL WOUND CLOSURE / REPAIR  4/10   RIGHT    Allergies  Allergen Reactions   Molds & Smuts Itching and Shortness Of Breath    Family History  Problem Relation Age of Onset   Hypertension Mother    Colon polyps Mother        pre-cancerous polyps   Hypertension Father    Heart disease Father    Prostate cancer Father    Colon polyps Father        pre-cancerous polyps   Breast cancer Maternal Grandmother    Colon cancer Paternal Grandmother 28   Kidney disease Neg Hx    Gallbladder disease Neg Hx    Esophageal cancer Neg Hx    Stomach cancer Neg Hx    Rectal cancer Neg Hx     Social History   Socioeconomic History   Marital status: Single    Spouse name: Not on file   Number of children: 2   Years of education: Not on file   Highest education level: Not on file  Occupational History   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[employer]  Tobacco Use   Smoking status: Former Smoker    Quit date: 05/28/1987    Years since quitting: 32.6   Smokeless tobacco: Never Used  Vaping Use   Vaping Use: Never used  Substance and Sexual Activity   Alcohol use: Yes    Alcohol/week: 0.0 standard drinks    Comment: occ   Drug use: No   Sexual activity: Yes    Comment: patients husband with vasectomy  Other Topics Concern   Not on file  Social History Narrative   Divorced, 2 sons   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   Social Determinants of Health   Financial Resource Strain:    Difficulty of Paying Living Expenses:   Food Insecurity:    Worried About Charity fundraiser in the Last Year:    Arboriculturist in the Last Year:    Transportation Needs:    Film/video editor (Medical):    Lack of Transportation (Non-Medical):   Physical Activity:    Days of Exercise per Week:    Minutes of Exercise per Session:   Stress:    Feeling of Stress :   Social Connections:    Frequency of Communication with Friends and Family:    Frequency of Social Gatherings with Friends and Family:    Attends Religious Services:    Active Member of Clubs or Organizations:    Attends Archivist Meetings:    Marital Status:    Vitals:   01/04/20 0959  BP: 120/70  Pulse: 90  Resp: 12  Temp: 98.2 F (36.8 C)  SpO2: 99%  Body mass index is 21.02 kg/m.  Wt Readings from Last 3 Encounters:  01/04/20 111 lb 4 oz (50.5 kg)  11/30/19 113 lb 4 oz (51.4 kg)  06/08/19 119 lb (54 kg)   Physical Exam Vitals and nursing note reviewed.  Constitutional:      General: She is not in acute distress.    Appearance: She is well-developed and normal weight.  HENT:     Head: Normocephalic and atraumatic.     Right Ear: Hearing, tympanic membrane, ear canal and external ear normal.     Left Ear: Hearing, tympanic membrane, ear canal and external ear normal.     Mouth/Throat:     Pharynx: Uvula midline.  Eyes:     General: No scleral icterus.    Extraocular Movements: Extraocular movements intact.     Conjunctiva/sclera: Conjunctivae normal.     Pupils: Pupils are equal, round, and reactive to light.  Neck:     Thyroid: No thyromegaly.     Trachea: No tracheal deviation.  Cardiovascular:     Rate and Rhythm: Normal rate and regular rhythm.     Pulses:          Dorsalis pedis pulses are 2+ on the right side and 2+ on the left side.     Heart sounds: No murmur heard.   Pulmonary:     Effort: Pulmonary effort is normal. No respiratory distress.     Breath sounds: Normal breath sounds.  Abdominal:     Palpations: Abdomen is soft. There is no hepatomegaly or mass.     Tenderness: There is no abdominal  tenderness.  Genitourinary:    Comments: Deferred to gyn. Musculoskeletal:     Comments: No signs of synovitis appreciated.  Lymphadenopathy:     Cervical: No cervical adenopathy.     Upper Body:     Right upper body: No supraclavicular adenopathy.     Left upper body: No supraclavicular adenopathy.  Skin:    General: Skin is warm.     Findings: Ecchymosis (Forearms) present. No erythema or rash.  Neurological:     General: No focal deficit present.     Mental Status: [name], place, and time.     Cranial Nerves: No cranial nerve deficit.     Coordination: Coordination normal.     Gait: Gait normal.     Deep Tendon Reflexes:     Reflex Scores:      Bicep reflexes are 2+ on the right side and 2+ on the left side.      Patellar reflexes are 2+ on the right side and 2+ on the left side. Psychiatric:        Mood and Affect: Affect normal. Mood is anxious.        Speech: Speech normal.     Comments: Well groomed, good eye contact.    ASSESSMENT AND PLAN:  Ms. Daejah Klebba was here today annual physical examination.  Orders Placed This Encounter  Procedures   Tdap vaccine greater than or equal to 7yo IM   Pneumococcal polysaccharide vaccine 23-valent greater than or equal to 2yo subcutaneous/IM    Routine adult health maintenance We discussed the importance of regular physical activity and healthy diet for prevention of chronic illness and/or complications. Preventive guidelines reviewed. Vaccination updated. Gyn preventive care with her gyn. Next CPE in a year.  Alcohol use disorder, moderate, dependence ([place]) After discussion of a few pharmacologic options. She would like  to try medication, some side effects discussed. Acamprosa 333 mg daily and titrate to bid as tolerated.  -     acamprosate (CAMPRAL) 333 MG tablet; Take 1 tablet (333 mg total) by mouth in the morning and at bedtime.  Need for Tdap vaccination -     Tdap  vaccine greater than or equal to 7yo IM  Need for pneumococcal vaccination -     Pneumococcal polysaccharide vaccine 23-valent greater than or equal to 2yo subcutaneous/IM  Pure hypertriglyceridemia She is not fasting today,will plan on checking FLP next visit.   Return in about 4 weeks (around 02/01/2020) for 4-5 weeks.   Konnor Vondrasek G. [place], MD  [facility]. [place] office.

## 2020-01-07 ENCOUNTER — Encounter: Payer: Self-pay | Admitting: Family Medicine

## 2020-01-09 ENCOUNTER — Other Ambulatory Visit: Payer: Self-pay

## 2020-01-09 DIAGNOSIS — E781 Pure hyperglyceridemia: Secondary | ICD-10-CM

## 2020-01-09 NOTE — Progress Notes (Signed)
Per SA pt only needs Lipid Panel drawn/order entered/thx dmf

## 2020-01-11 ENCOUNTER — Other Ambulatory Visit: Payer: Self-pay

## 2020-01-11 ENCOUNTER — Other Ambulatory Visit: Payer: 59

## 2020-01-11 DIAGNOSIS — E781 Pure hyperglyceridemia: Secondary | ICD-10-CM

## 2020-01-12 LAB — LIPID PANEL
Cholesterol: 280 mg/dL — ABNORMAL HIGH (ref <200)
HDL: 52 mg/dL (ref >=50)
Non-HDL Cholesterol (Calc): 228 mg/dL (calc) — ABNORMAL HIGH (ref <130)
Total CHOL/HDL Ratio: 5.4 (calc) — ABNORMAL HIGH (ref <5.0)
Triglycerides: 973 mg/dL — ABNORMAL HIGH (ref <150)

## 2020-01-13 MED ORDER — GEMFIBROZIL 600 MG PO TABS: 600.0000 mg | ORAL_TABLET | Freq: Two times a day (BID) | ORAL | 2 refills | Status: DC

## 2020-01-27 ENCOUNTER — Other Ambulatory Visit: Payer: Self-pay | Admitting: Family Medicine

## 2020-01-27 DIAGNOSIS — F1020 Alcohol dependence, uncomplicated: Secondary | ICD-10-CM

## 2020-01-28 ENCOUNTER — Other Ambulatory Visit: Payer: Self-pay | Admitting: Family Medicine

## 2020-02-01 ENCOUNTER — Encounter: Payer: Self-pay | Admitting: Family Medicine

## 2020-02-01 ENCOUNTER — Telehealth (INDEPENDENT_AMBULATORY_CARE_PROVIDER_SITE_OTHER): Payer: 59 | Admitting: Family Medicine

## 2020-02-01 DIAGNOSIS — R7401 Elevation of levels of liver transaminase levels: Secondary | ICD-10-CM | POA: Diagnosis not present

## 2020-02-01 DIAGNOSIS — F1020 Alcohol dependence, uncomplicated: Secondary | ICD-10-CM | POA: Diagnosis not present

## 2020-02-01 DIAGNOSIS — U071 COVID-19: Secondary | ICD-10-CM

## 2020-02-01 DIAGNOSIS — E781 Pure hyperglyceridemia: Secondary | ICD-10-CM

## 2020-02-01 NOTE — Progress Notes (Signed)
Virtual Visit via Video Note I connected with Shelly Northrup on 02/01/20 by a video enabled telemedicine application and verified that I am speaking with the correct person using two identifiers.  Location patient: home Location provider:work office Persons participating in the virtual visit: patient, provider  I discussed the limitations of evaluation and management by telemedicine and the availability of in person appointments. The patient expressed understanding and agreed to proceed.  HPI: Shelly Edwards is a 49 yo female following on her last OV. She was last seen on 01/04/2020. Currently she is on Celexa 10 mg daily, she is not sure if medication is really helping, she has not noted any side effect. She is still walking from home, walking a few days per week with a friend.  Last office acamprosate 333 mg bid   was also recommended to treat alcohol disorder, she has not started medication, afraid of side effects. I chose this medication because elevated transaminases.  Still drinking daily, stable amount.  Lab Results  Component Value Date   ALT 33 11/30/2019   AST 85 (H) 11/30/2019   ALKPHOS 81 11/30/2019   BILITOT 0.4 11/30/2019   She has some questions about lopid , which was started to treat hyperlipidemia/hypertriglyceridemia.  She is on Lopid 600 mg twice daily.  She has tolerated medication well. In general she follows a healthful/low-fat diet.  Lab Results  Component Value Date   CHOL 280 (H) 01/11/2020   HDL 52 01/11/2020   Cecilia  01/11/2020     Comment:     . LDL cholesterol not calculated. Triglyceride levels greater than 400 mg/dL invalidate calculated LDL results. . Reference range: <100 . Desirable range <100 mg/dL for primary prevention;   <70 mg/dL for patients with CHD or diabetic patients  with > or = 2 CHD risk factors. Marland Kitchen LDL-C is now calculated using the Martin-Hopkins  calculation, which is a validated novel method providing  better accuracy than  the Friedewald equation in the  estimation of LDL-C.  Cresenciano Genre et al. Annamaria Helling. 3154;008(67): 2061-2068  (http://education.[facility].com/faq/FAQ164)    TRIG 973 (H) 01/11/2020   CHOLHDL 5.4 (H) 01/11/2020   Since her last OV, she was dx'ed with COVID 19 infection. Initially rapid test was borderline,then negative,so PCR was sent out. She just got results yesterday and it is positive. She is not sure about accuracy of result because her pt number is not the same she got when she was tested.  She started symptoms on 01/26/20. Nonproductive cough, fatigue, body aches. Negative for fever, chills, anosmia, ageusia, sore throat, wheezing, dyspnea, abdominal pain, N/V, or changes in bowel habits. She is feeling better, still having some nonproductive cough. No known sick contact. She is fully vaccinated.  ROS: See pertinent positives and negatives per HPI.  Past Medical History:  Diagnosis Date  . Allergic rhinitis   . Allergy   . Asthma   . Hx of adenomatous polyp of colon 06/14/2019   Past Surgical History:  Procedure Laterality Date  . AUGMENTATION MAMMAPLASTY    . COLONOSCOPY  2016  . UMBILICAL HERNIA REPAIR  2008  . VAGINAL WOUND CLOSURE / REPAIR  4/10   RIGHT   Family History  Problem Relation Age of Onset  . Hypertension Mother   . Colon polyps Mother        pre-cancerous polyps  . Hypertension Father   . Heart disease Father   . Prostate cancer Father   . Colon polyps Father  pre-cancerous polyps  . Breast cancer Maternal Grandmother   . Colon cancer Paternal Grandmother 23  . Kidney disease Neg Hx   . Gallbladder disease Neg Hx   . Esophageal cancer Neg Hx   . Stomach cancer Neg Hx   . Rectal cancer Neg Hx     Social History   Socioeconomic History  . Marital status: Single    Spouse name: Not on file  . Number of children: 2  . Years of education: Not on file  . Highest education level: Not on file  Occupational History  . Occupation: Nurse, mental health: [employer]  Tobacco Use  . Smoking status: Former Smoker    Quit date: 05/28/1987    Years since quitting: 32.7  . Smokeless tobacco: Never Used  Vaping Use  . Vaping Use: Never used  Substance and Sexual Activity  . Alcohol use: Yes    Alcohol/week: 0.0 standard drinks    Comment: occ  . Drug use: No  . Sexual activity: Yes    Comment: patients husband with vasectomy  Other Topics Concern  . 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 Difficulty of Paying Living Expenses: Not on file  Food Insecurity:   . Worried About Charity fundraiser in the Last Year: Not on file  . Ran Out of Food in the Last Year: Not on file  Transportation Needs:   . Lack of Transportation (Medical): Not on file  . Lack of Transportation (Non-Medical): Not on file  Physical Activity:   . Days of Exercise per Week: Not on file  . Minutes of Exercise per Session: Not on file  Stress:   . Feeling of Stress : Not on file  Social Connections:   . Frequency of Communication with Friends and Family: Not on file  . Frequency of Social Gatherings with Friends and Family: Not on file  . Attends Religious Services: Not on file  . Active Member of Clubs or Organizations: Not on file  . Attends Archivist Meetings: Not on file  . Marital Status: Not on file  Intimate Partner Violence:   . Fear of Current or Ex-Partner: Not on file  . Emotionally Abused: Not on file  . Physically Abused: Not on file  . Sexually Abused: Not on file    Current Outpatient Medications:  .  acamprosate (CAMPRAL) 333 MG tablet, TAKE 1 TABLET (333 MG TOTAL) BY MOUTH IN THE MORNING AND AT BEDTIME., Disp: 60 tablet, Rfl: 1 .  ADVAIR DISKUS 100-50 MCG/DOSE AEPB, Inhale 1 puff into the lungs 2 (two) times daily. , Disp: , Rfl:  .  Calcium Carb-Cholecalciferol  (CALCIUM 600+D3 PO), Take by mouth., Disp: , Rfl:  .  Cholecalciferol (VITAMIN D PO), Take 1 tablet by mouth daily., Disp: , Rfl:  .  citalopram (CELEXA) 10 MG tablet, TAKE 1 TABLET BY MOUTH EVERY DAY, Disp: 30 tablet, Rfl: 1 .  clobetasol (OLUX) 0.05 % topical foam, APPLY TO AFFECTED AREAS OF BODY (NOT FACE) 1 2 TIMES DAILY AS NEEDED, Disp: , Rfl:  .  diphenhydrAMINE (BENADRYL) 25 MG tablet, Take 25 mg by mouth as needed., Disp: , Rfl:  .  fluticasone (FLONASE) 50 MCG/ACT nasal spray, Place into both nostrils daily., Disp: , Rfl:  .  gemfibrozil (LOPID)  600 MG tablet, Take 1 tablet (600 mg total) by mouth 2 (two) times daily before a meal., Disp: 60 tablet, Rfl: 2 .  ondansetron (ZOFRAN-ODT) 8 MG disintegrating tablet, TAKE 1 TABLET BY MOUTH EVERY 8 HOURS AS NEEDED FOR NAUSEA, VOMITING OR REFRACTORY NAUSEA / VOMITING., Disp: 20 tablet, Rfl: 0 .  SUMAtriptan (IMITREX) 50 MG tablet, Take 1 tablet (50 mg total) by mouth daily as needed for migraine. May repeat in 2 hours if headache persists or recurs., Disp: 10 tablet, Rfl: 1 .  thiamine 100 MG tablet, Take 1 tablet (100 mg total) by mouth daily., Disp: 90 tablet, Rfl: 3 .  VENTOLIN HFA 108 (90 BASE) MCG/ACT inhaler, Inhale 1-2 puffs into the lungs as needed. Reported on 05/29/2015, Disp: , Rfl:   EXAM:  VITALS per patient if applicable:N/A  GENERAL: alert, oriented, appears well and in no acute distress  HEENT: atraumatic, conjunctiva clear, no obvious abnormalities on inspection.  NECK: normal movements of the head and neck  LUNGS: on inspection no signs of respiratory distress, breathing rate appears normal, no obvious gross SOB, gasping or wheezing. Non productive cough during visit x 1.  CV: no obvious cyanosis  Shelly: moves all visible extremities without noticeable abnormality  PSYCH/NEURO: pleasant and cooperative, no obvious depression or anxiety, speech and thought processing grossly intact  ASSESSMENT AND PLAN:  Discussed the  following assessment and plan: Orders Placed This Encounter  Procedures  . Hepatic function panel   Elevated transaminase level  AST>ALT. Recommend trying to decrease alcohol intake, skip alcohol intake every other day is another option. We will re-check LFT's in a couple of weeks.  Alcohol use disorder, moderate, dependence ([facility])  Acamprosate was recommended due to elevated transaminases (2.5 times the normal). Studies with this medication were done with abstinent pts, general recommendations are to start it right after withdrawal symptoms resolved but not to stop it if there is a relapse. We could re-check LFT's and if beter we could try Naltrexone.  In regard to Celexa, which was recommended to treat possible underline depression/anxiety, if she is not noticing benefits, she can start weaning med off. She can resume if needed.  COVID-19 virus infection Symptoms have improved. Quarantine until 14 days after symptoms started. Monitor for signs of complications. She already has an appointment today to have COVID-19 test done, this was scheduled before she receiving results, recommend keeping appointment.  Pure hypertriglyceridemia Tolerating Lopid well, no changes. We discussed possible complications of elevated triglycerides. We will plan on checking lipid panel in 2 to 3 months.  I discussed the assessment and treatment plan with the patient. Shelly Deegan was provided an opportunity to ask questions and all were answered. She agreed with the plan and demonstrated an understanding of the instructions.  Return for Lab appt in 2-3 weeks.   Rukia Mcgillivray [place], MD

## 2020-02-10 ENCOUNTER — Other Ambulatory Visit: Payer: Self-pay | Admitting: Internal Medicine

## 2020-02-10 ENCOUNTER — Telehealth: Payer: Self-pay | Admitting: Internal Medicine

## 2020-02-10 NOTE — Telephone Encounter (Signed)
I spoke with Shelly Edwards and told her I have refilled her zofran. She said her insurance will only pay for #9 at a time. I told her that is what I sent in today. She will call us back with any problems if they occur with the rx.

## 2020-02-10 NOTE — Telephone Encounter (Signed)
Pt is requesting a refill on her Zofran, pt needs an increment of 9. Pt would like to discuss this further with the nurse.

## 2020-02-15 ENCOUNTER — Other Ambulatory Visit: Payer: 59

## 2020-02-15 ENCOUNTER — Other Ambulatory Visit: Payer: Self-pay

## 2020-02-15 DIAGNOSIS — R7401 Elevation of levels of liver transaminase levels: Secondary | ICD-10-CM

## 2020-02-15 DIAGNOSIS — F1020 Alcohol dependence, uncomplicated: Secondary | ICD-10-CM

## 2020-02-16 LAB — HEPATIC FUNCTION PANEL
AG Ratio: 1.3 (calc) (ref 1.0–2.5)
ALT: 21 U/L (ref 6–29)
AST: 40 U/L — ABNORMAL HIGH (ref 10–35)
Albumin: 3.4 g/dL — ABNORMAL LOW (ref 3.6–5.1)
Alkaline phosphatase (APISO): 91 U/L (ref 31–125)
Bilirubin, Direct: 0.3 mg/dL — ABNORMAL HIGH (ref 0.0–0.2)
Globulin: 2.7 g/dL (calc) (ref 1.9–3.7)
Indirect Bilirubin: 0.3 mg/dL (calc) (ref 0.2–1.2)
Total Bilirubin: 0.6 mg/dL (ref 0.2–1.2)
Total Protein: 6.1 g/dL (ref 6.1–8.1)

## 2020-02-17 ENCOUNTER — Other Ambulatory Visit: Payer: Self-pay

## 2020-02-17 DIAGNOSIS — F1020 Alcohol dependence, uncomplicated: Secondary | ICD-10-CM

## 2020-02-17 MED ORDER — ACAMPROSATE CALCIUM 333 MG PO TBEC: 333.0000 mg | DELAYED_RELEASE_TABLET | Freq: Two times a day (BID) | ORAL | 1 refills | Status: DC

## 2020-02-27 ENCOUNTER — Encounter: Payer: Self-pay | Admitting: Family Medicine

## 2020-02-27 ENCOUNTER — Other Ambulatory Visit: Payer: Self-pay | Admitting: Family Medicine

## 2020-02-27 DIAGNOSIS — F1020 Alcohol dependence, uncomplicated: Secondary | ICD-10-CM

## 2020-02-27 MED ORDER — NALTREXONE HCL 50 MG PO TABS: 25.0000 mg | ORAL_TABLET | Freq: Every day | ORAL | 1 refills | Status: DC

## 2020-03-23 ENCOUNTER — Other Ambulatory Visit: Payer: Self-pay | Admitting: Internal Medicine

## 2020-04-09 ENCOUNTER — Other Ambulatory Visit: Payer: Self-pay | Admitting: Family Medicine

## 2020-05-14 ENCOUNTER — Other Ambulatory Visit: Payer: Self-pay | Admitting: Internal Medicine

## 2020-05-29 ENCOUNTER — Other Ambulatory Visit: Payer: Self-pay

## 2020-05-29 ENCOUNTER — Encounter: Payer: Self-pay | Admitting: Obstetrics & Gynecology

## 2020-05-29 ENCOUNTER — Ambulatory Visit (INDEPENDENT_AMBULATORY_CARE_PROVIDER_SITE_OTHER): Payer: 59 | Admitting: Obstetrics & Gynecology

## 2020-05-29 VITALS — BP 140/80 | Ht 61.0 in | Wt 110.0 lb

## 2020-05-29 DIAGNOSIS — R87610 Atypical squamous cells of undetermined significance on cytologic smear of cervix (ASC-US): Secondary | ICD-10-CM

## 2020-05-29 DIAGNOSIS — Z01419 Encounter for gynecological examination (general) (routine) without abnormal findings: Secondary | ICD-10-CM

## 2020-05-29 DIAGNOSIS — Z3009 Encounter for other general counseling and advice on contraception: Secondary | ICD-10-CM | POA: Diagnosis not present

## 2020-05-29 NOTE — Progress Notes (Signed)
Shelly Edwards Oct 10, 1970 [id number]   History:    49 y.o. G2P2L2 G2P2L2 Divorced. Stable boyfriend. Sons 29 and 34 yo.  JJ:[id number] presenting for annual gyn exam   LFY:BOFBPZWCHENIDPOE normal and regular every 3 1/2 weeks. No breakthrough bleeding. No pelvic pain. Sexually active with no pain with intercourse. Not using contraception. Breastsnormal. Body mass index 20.78. Exercising regularly. Fasting health labs with Fam MD. Family history of colon cancer.  Colonoscopy 05/2019 Benign polyp.  Health Labs with Fam MD.  Past medical history,surgical history, family history and social history were all reviewed and documented in the EPIC chart.  Gynecologic History Patient's last menstrual period was 05/10/2020.  Obstetric History OB History  Gravida Para Term Preterm AB Living  2 2 2    0 2  SAB IAB Ectopic Multiple Live Births  0   0   2    # Outcome Date GA Lbr Len/2nd Weight Sex Delivery Anes PTL Lv  2 Term     M Vag-Spont  N LIV  1 Term     M Vag-Spont  N LIV     ROS: A ROS was performed and pertinent positives and negatives are included in the history.  GENERAL: No fevers or chills. HEENT: No change in vision, no earache, sore throat or sinus congestion. NECK: No pain or stiffness. CARDIOVASCULAR: No chest pain or pressure. No palpitations. PULMONARY: No shortness of breath, cough or wheeze. GASTROINTESTINAL: No abdominal pain, nausea, vomiting or diarrhea, melena or bright red blood per rectum. GENITOURINARY: No urinary frequency, urgency, hesitancy or dysuria. MUSCULOSKELETAL: No joint or muscle pain, no back pain, no recent trauma. DERMATOLOGIC: No rash, no itching, no lesions. ENDOCRINE: No polyuria, polydipsia, no heat or cold intolerance. No recent change in weight. HEMATOLOGICAL: No anemia or easy bruising or bleeding. NEUROLOGIC: No headache, seizures, numbness, tingling or weakness. PSYCHIATRIC: No depression, no loss of  interest in normal activity or change in sleep pattern.     Exam:   BP 140/80 (BP Location: Right Arm, Patient Position: Sitting, Cuff Size: Normal)   Ht 5\' 1"  (1.549 m)   Wt 110 lb (49.9 kg)   LMP 05/10/2020   BMI 20.78 kg/m   Body mass index is 20.78 kg/m.  General appearance : Well developed well nourished female. No acute distress HEENT: Eyes: no retinal hemorrhage or exudates,  Neck supple, trachea midline, no carotid bruits, no thyroidmegaly Lungs: Clear to auscultation, no rhonchi or wheezes, or rib retractions  Heart: Regular rate and rhythm, no murmurs or gallops Breast:Examined in sitting and supine position were symmetrical in appearance, no palpable masses or tenderness,  no skin retraction, no nipple inversion, no nipple discharge, no skin discoloration, no axillary or supraclavicular lymphadenopathy Abdomen: no palpable masses or tenderness, no rebound or guarding Extremities: no edema or skin discoloration or tenderness  Pelvic: Vulva: Normal             Vagina: No gross lesions or discharge  Cervix: No gross lesions or discharge.  Pap reflex done.  Uterus  AV, normal size, shape and consistency, non-tender and mobile  Adnexa  Without masses or tenderness  Anus: Normal   Assessment/Plan:  49 y.o. female for annual exam   1. Encounter for routine gynecological examination with Papanicolaou smear of cervix Normal gynecologic exam. Pap reflex done.  Breast exam normal.  Will schedule screening mammo now.  Colono 2020 benign polyps.  BMI 20.78.  Fit and good nutrition.  Health labs with Fam MD.  2. ASCUS of cervix with negative high risk HPV Pap reflex done  3. Encounter for other general counseling or advice on contraception Declines contraception.  Princess Bruins MD, 10:07 AM 05/29/2020

## 2020-05-31 ENCOUNTER — Encounter: Payer: Self-pay | Admitting: Obstetrics & Gynecology

## 2020-06-07 LAB — PAP IG W/ RFLX HPV ASCU

## 2020-06-07 LAB — HUMAN PAPILLOMAVIRUS, HIGH RISK: HPV DNA High Risk: NOT DETECTED

## 2020-06-27 ENCOUNTER — Ambulatory Visit: Payer: 59 | Admitting: Obstetrics & Gynecology

## 2020-07-03 ENCOUNTER — Ambulatory Visit: Payer: 59 | Admitting: Obstetrics & Gynecology

## 2020-07-03 ENCOUNTER — Encounter: Payer: Self-pay | Admitting: Obstetrics & Gynecology

## 2020-07-03 ENCOUNTER — Other Ambulatory Visit: Payer: Self-pay

## 2020-07-03 VITALS — BP 118/72 | HR 80 | Resp 14 | Ht 62.0 in | Wt 118.0 lb

## 2020-07-03 DIAGNOSIS — R87610 Atypical squamous cells of undetermined significance on cytologic smear of cervix (ASC-US): Secondary | ICD-10-CM | POA: Diagnosis not present

## 2020-07-03 DIAGNOSIS — N870 Mild cervical dysplasia: Secondary | ICD-10-CM

## 2020-07-03 NOTE — Addendum Note (Signed)
Addended by: Karmen Bongo T on: 07/03/2020 03:15 PM   Modules accepted: Orders

## 2020-07-03 NOTE — Progress Notes (Signed)
    Shelly Edwards Feb 21, 1971 [id number]        51 y.o.  G2P2L2   RP: ASCUS with Neg HR HPV x 2 for Colposcopy  HPI: ASCUS/HPV HR Neg x 2.  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   OB History  Gravida Para Term Preterm AB Living  2 2 2    0 2  SAB IAB Ectopic Multiple Live Births  0   0   2    # Outcome Date GA Lbr Len/2nd Weight Sex Delivery Anes PTL Lv  2 Term     M Vag-Spont  N LIV  1 Term     M Vag-Spont  N LIV    Past medical history,surgical history, problem list, medications, allergies, family history and social history were all reviewed and documented in the EPIC chart.   Directed ROS with pertinent positives and negatives documented in the history of present illness/assessment and plan.  Exam:  Vitals:   07/03/20 1435  BP: 118/72  Pulse: 80  Resp: 14  Weight: 118 lb (53.5 kg)  Height: 5\' 2"  (1.575 m)   General appearance:  Normal  Colposcopy Procedure Note Shelly Edwards 07/03/2020  Indications:  ASCUS x 2 with Neg HR HPV  Procedure Details  The risks and benefits of the procedure and Verbal informed consent obtained.  Speculum placed in vagina and excellent visualization of cervix achieved, cervix swabbed x 3 with acetic acid solution.  Findings:  Cervix colposcopy: Physical Exam Genitourinary:       Vaginal colposcopy: Normal  Vulvar colposcopy: Normal  Perirectal colposcopy: Normal  The cervix was sprayed with Hurricane before performing the cervical biopsies.  Specimens: Cervical Bx at 10 O'Clock  Complications: None, good hemostasis with Silver Nitrate . Plan:  Management per Cervical Bx results   Assessment/Plan:  50 y.o. G2P2002   1. ASCUS of cervix with negative high risk HPV ASCUS/HPV HR Neg x 2.  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  Colposcopy findings reviewed with patient.  Management per Cervical Bx results.  2. 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  Shelly Bruins MD, 2:59 PM  07/03/2020

## 2020-07-05 LAB — PATHOLOGY REPORT

## 2020-07-05 LAB — TISSUE SPECIMEN

## 2020-07-27 ENCOUNTER — Other Ambulatory Visit: Payer: Self-pay | Admitting: Internal Medicine

## 2020-09-17 ENCOUNTER — Other Ambulatory Visit: Payer: Self-pay | Admitting: Internal Medicine

## 2020-10-01 ENCOUNTER — Other Ambulatory Visit: Payer: Self-pay | Admitting: Family Medicine

## 2020-10-01 DIAGNOSIS — R112 Nausea with vomiting, unspecified: Secondary | ICD-10-CM

## 2020-10-04 IMAGING — MG DIGITAL SCREENING BILATERAL MAMMOGRAM WITH IMPLANTS, CAD AND TOM
9 of 12 series · 9 of 28 positions shown · non-contrast
Comparison: Previous exam(s).

CLINICAL DATA: Screening.

EXAM:
DIGITAL SCREENING BILATERAL MAMMOGRAM WITH IMPLANTS, CAD AND TOMO
The patient has bilateral retropectoral saline implants. Standard
and implant displaced views were performed.

[L CC]
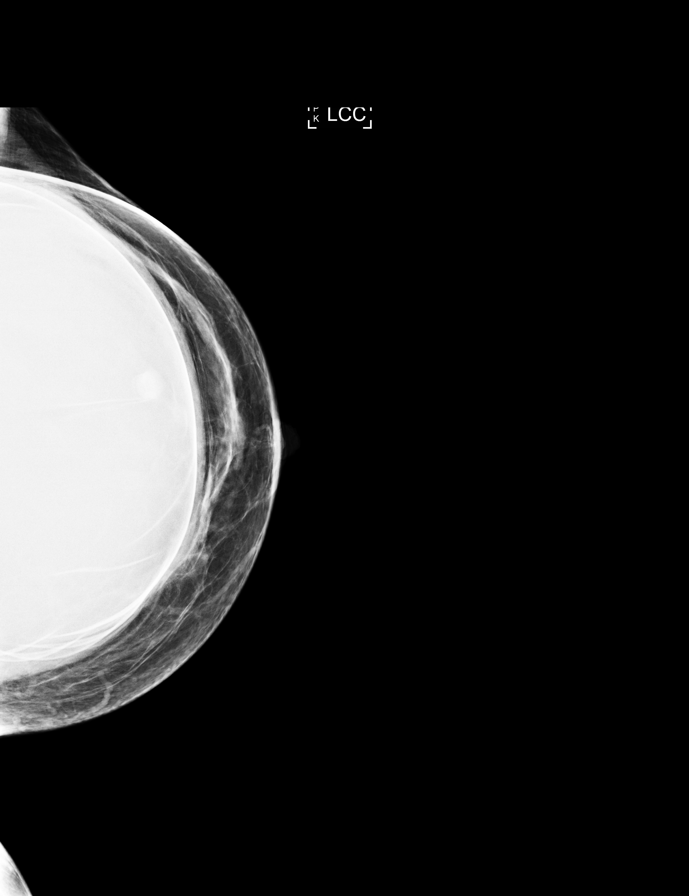

[R CC]
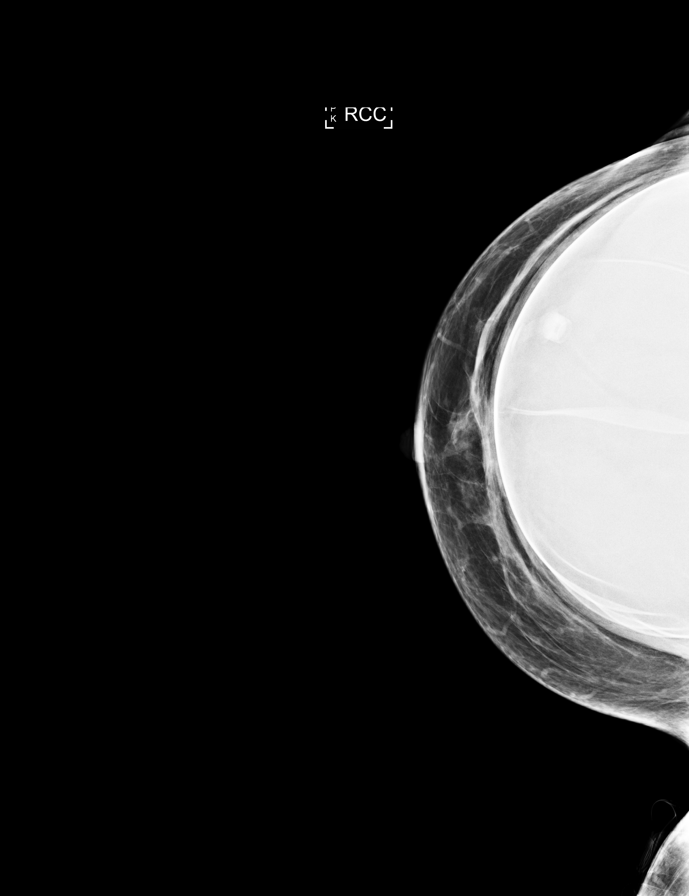

[L MLO]
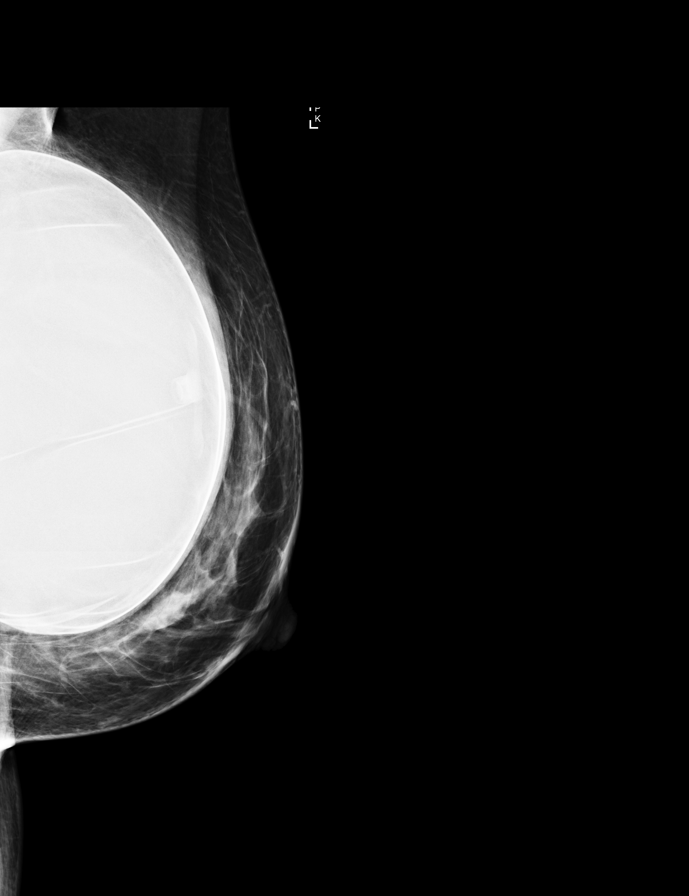

[R MLO]
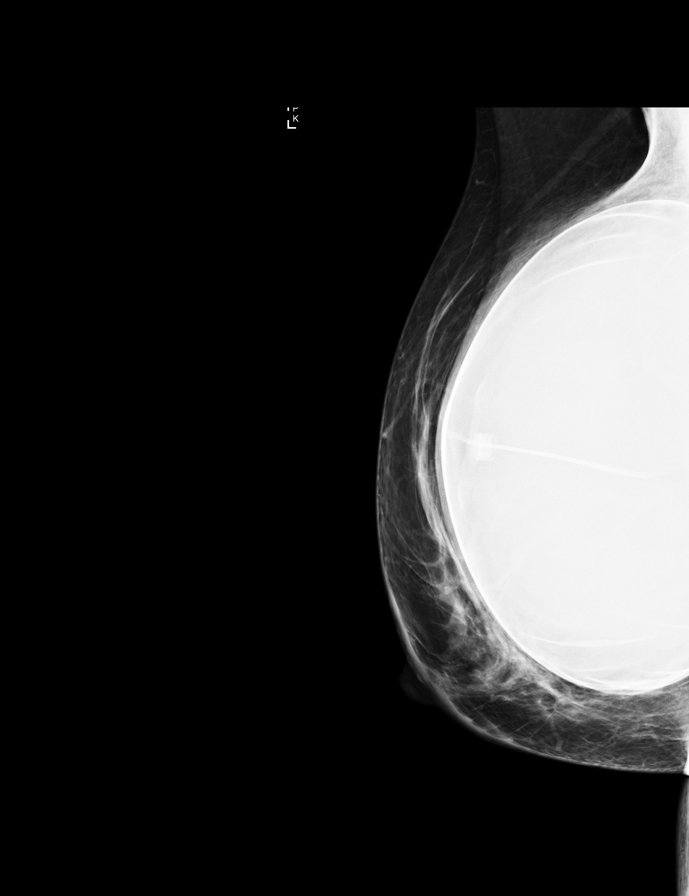

[R MLO synth-2D]
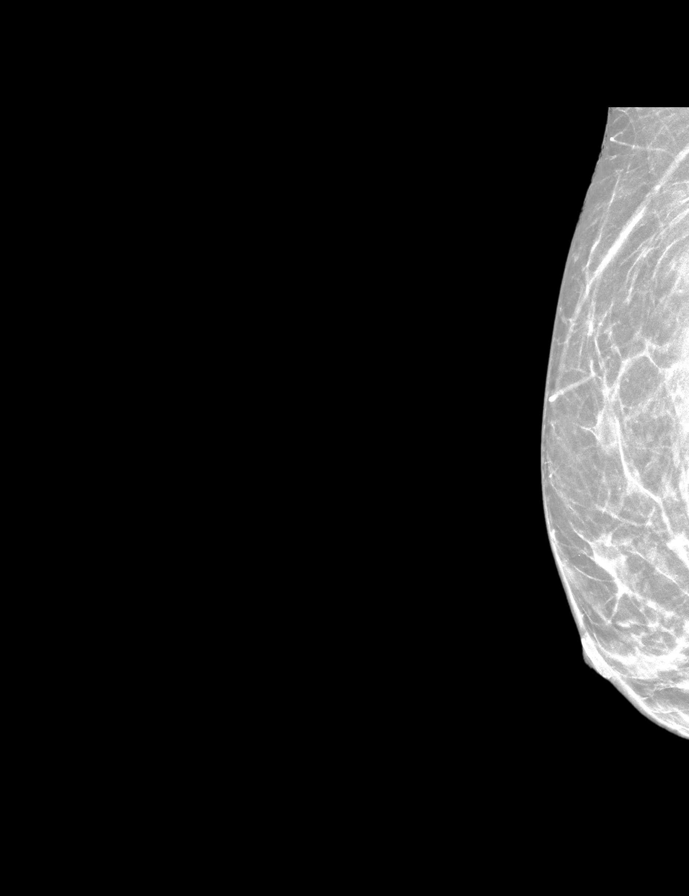

[L CC synth-2D]
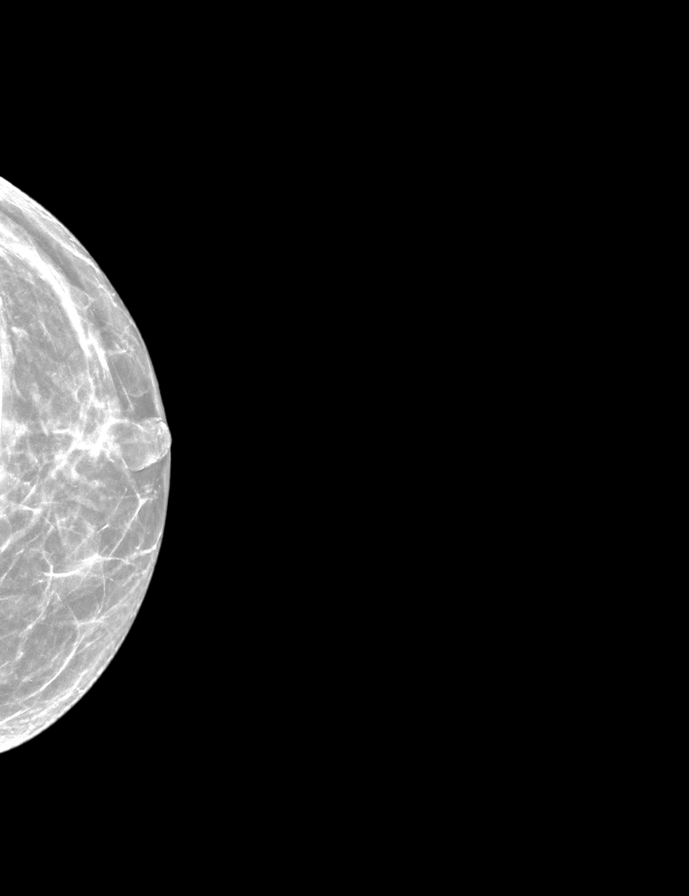

[L MLO synth-2D]
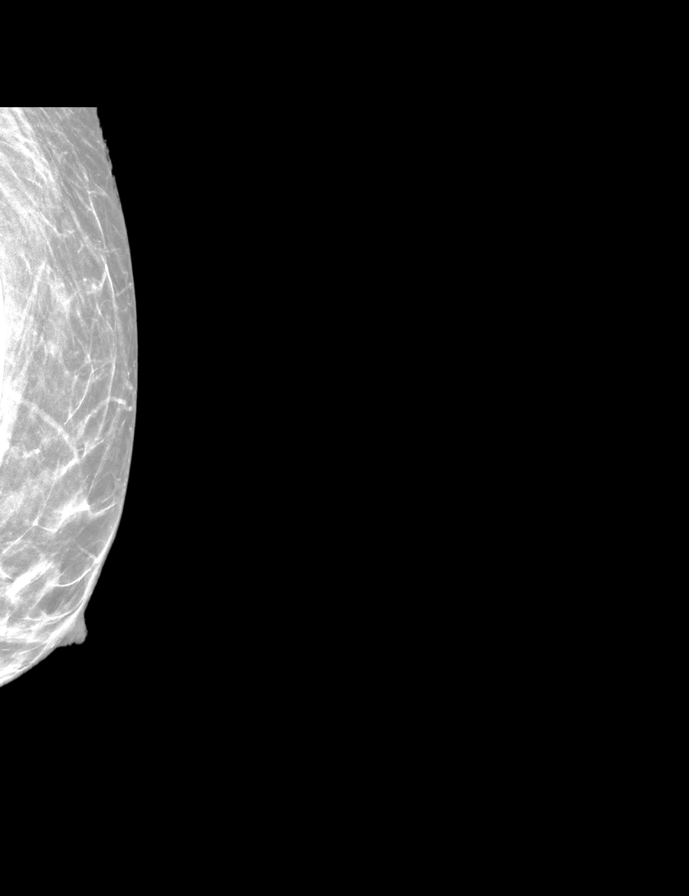

[R CC synth-2D]
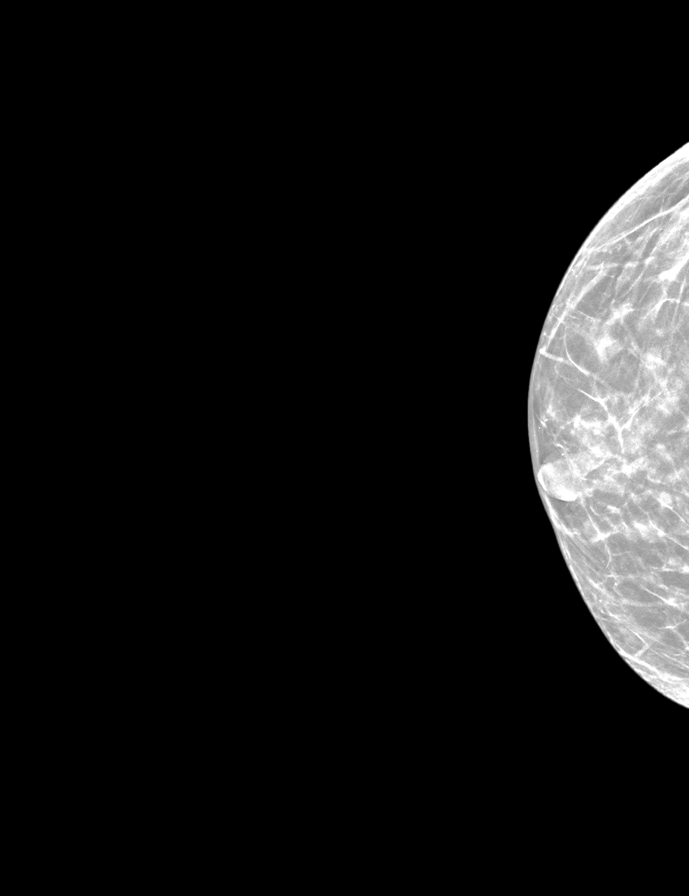

[L CCID BREAST TOMOSYNTHESIS IMAGE tomo · tomo slice 23/44.0]
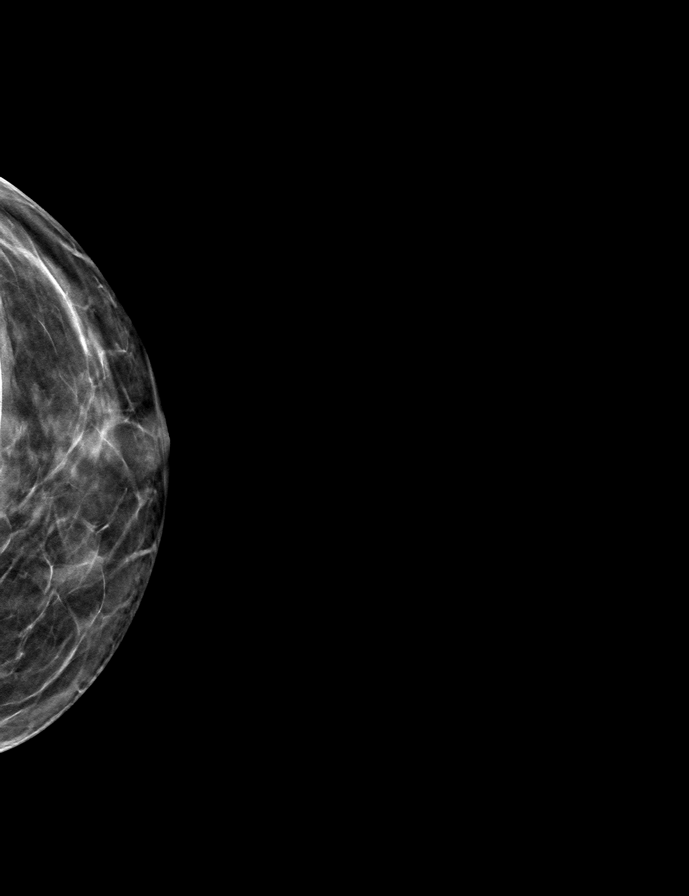

[9 of 28 positions shown; findings below may reference images not displayed]

ACR Breast Density Category c: The breast tissue is heterogeneously
dense, which may obscure small masses.
FINDINGS: There are no findings suspicious for malignancy. Images were
processed with CAD.
IMPRESSION: No mammographic evidence of malignancy. A result letter of this
screening mammogram will be mailed directly to the patient.

RECOMMENDATION:
Screening mammogram in one year. (Code:T6-X-1YY)

BI-RADS CATEGORY  1:  Negative.

## 2020-11-24 ENCOUNTER — Other Ambulatory Visit: Payer: Self-pay | Admitting: Internal Medicine

## 2021-01-04 ENCOUNTER — Ambulatory Visit (INDEPENDENT_AMBULATORY_CARE_PROVIDER_SITE_OTHER): Payer: 59 | Admitting: Family Medicine

## 2021-01-04 ENCOUNTER — Other Ambulatory Visit: Payer: Self-pay

## 2021-01-04 ENCOUNTER — Encounter: Payer: Self-pay | Admitting: Family Medicine

## 2021-01-04 VITALS — BP 124/70 | HR 78 | Resp 12 | Ht 62.0 in | Wt 119.4 lb

## 2021-01-04 DIAGNOSIS — Z0000 Encounter for general adult medical examination without abnormal findings: Secondary | ICD-10-CM

## 2021-01-04 DIAGNOSIS — E559 Vitamin D deficiency, unspecified: Secondary | ICD-10-CM | POA: Diagnosis not present

## 2021-01-04 DIAGNOSIS — Z130 Encounter for screening for diseases of the blood and blood-forming organs and certain disorders involving the immune mechanism: Secondary | ICD-10-CM | POA: Diagnosis not present

## 2021-01-04 DIAGNOSIS — F1020 Alcohol dependence, uncomplicated: Secondary | ICD-10-CM | POA: Diagnosis not present

## 2021-01-04 DIAGNOSIS — E538 Deficiency of other specified B group vitamins: Secondary | ICD-10-CM | POA: Diagnosis not present

## 2021-01-04 DIAGNOSIS — Z13228 Encounter for screening for other metabolic disorders: Secondary | ICD-10-CM | POA: Diagnosis not present

## 2021-01-04 DIAGNOSIS — E781 Pure hyperglyceridemia: Secondary | ICD-10-CM

## 2021-01-04 DIAGNOSIS — Z1329 Encounter for screening for other suspected endocrine disorder: Secondary | ICD-10-CM | POA: Diagnosis not present

## 2021-01-04 DIAGNOSIS — G4700 Insomnia, unspecified: Secondary | ICD-10-CM

## 2021-01-04 LAB — CBC WITH DIFFERENTIAL/PLATELET
Basophils Absolute: 0.1 10*3/uL (ref 0.0–0.1)
Basophils Relative: 1.2 % (ref 0.0–3.0)
Eosinophils Absolute: 0.1 10*3/uL (ref 0.0–0.7)
Eosinophils Relative: 1.2 % (ref 0.0–5.0)
HCT: 37.5 % (ref 36.0–46.0)
Hemoglobin: 12.5 g/dL (ref 12.0–15.0)
Lymphocytes Relative: 22 % (ref 12.0–46.0)
Lymphs Abs: 1.2 10*3/uL (ref 0.7–4.0)
MCHC: 33.2 g/dL (ref 30.0–36.0)
MCV: 95.9 fl (ref 78.0–100.0)
Monocytes Absolute: 0.6 10*3/uL (ref 0.1–1.0)
Monocytes Relative: 10.7 % (ref 3.0–12.0)
Neutro Abs: 3.6 10*3/uL (ref 1.4–7.7)
Neutrophils Relative %: 64.9 % (ref 43.0–77.0)
Platelets: 259 10*3/uL (ref 150.0–400.0)
RBC: 3.91 Mil/uL (ref 3.87–5.11)
RDW: 13.5 % (ref 11.5–15.5)
WBC: 5.5 10*3/uL (ref 4.0–10.5)

## 2021-01-04 LAB — LIPID PANEL
Cholesterol: 220 mg/dL — ABNORMAL HIGH (ref 0–200)
HDL: 100.8 mg/dL (ref >39.00)
LDL Cholesterol: 91 mg/dL (ref 0–99)
NonHDL: 118.79
Total CHOL/HDL Ratio: 2
Triglycerides: 137 mg/dL (ref 0.0–149.0)
VLDL: 27.4 mg/dL (ref 0.0–40.0)

## 2021-01-04 LAB — HEPATIC FUNCTION PANEL
ALT: 17 U/L (ref 0–35)
AST: 30 U/L (ref 0–37)
Albumin: 3.9 g/dL (ref 3.5–5.2)
Alkaline Phosphatase: 62 U/L (ref 39–117)
Bilirubin, Direct: 0.4 mg/dL — ABNORMAL HIGH (ref 0.0–0.3)
Total Bilirubin: 0.6 mg/dL (ref 0.2–1.2)
Total Protein: 6.6 g/dL (ref 6.0–8.3)

## 2021-01-04 LAB — VITAMIN B12: Vitamin B-12: 246 pg/mL (ref 211–911)

## 2021-01-04 LAB — BASIC METABOLIC PANEL
BUN: 10 mg/dL (ref 6–23)
CO2: 24 mEq/L (ref 19–32)
Calcium: 9.4 mg/dL (ref 8.4–10.5)
Chloride: 101 mEq/L (ref 96–112)
Creatinine, Ser: 0.71 mg/dL (ref 0.40–1.20)
GFR: 98.96 mL/min (ref >60.00)
Glucose, Bld: 74 mg/dL (ref 70–99)
Potassium: 4.4 mEq/L (ref 3.5–5.1)
Sodium: 137 mEq/L (ref 135–145)

## 2021-01-04 LAB — HEMOGLOBIN A1C: Hgb A1c MFr Bld: 5.1 % (ref 4.6–6.5)

## 2021-01-04 LAB — TSH: TSH: 0.88 u[IU]/mL (ref 0.35–5.50)

## 2021-01-04 LAB — VITAMIN D 25 HYDROXY (VIT D DEFICIENCY, FRACTURES): VITD: 50.66 ng/mL (ref 30.00–100.00)

## 2021-01-04 MED ORDER — TRAZODONE HCL 50 MG PO TABS: 25.0000 mg | ORAL_TABLET | Freq: Every day | ORAL | 3 refills | Status: DC

## 2021-01-04 NOTE — Patient Instructions (Addendum)
Today you have you routine preventive visit. A few things to remember from today's visit:   Routine general medical examination at a health care facility  Vitamin D deficiency, unspecified - Plan: VITAMIN D 25 Hydroxy (Vit-D Deficiency, Fractures)  B12 deficiency - Plan: Vitamin B12  Pure hypertriglyceridemia - Plan: Lipid panel, TSH  Screening for endocrine, metabolic and immunity disorder - Plan: Basic metabolic panel, Hemoglobin A1c  Alcohol use disorder, moderate, dependence ([place]) - Plan: CBC with Differential/Platelet, Hepatic function panel  If you need refills please call your pharmacy. Today Trazodone started. If you are going to establish with mental health group, they can continue following ,otherwise I need to see you in 3-4 months, before if needed.  Do not use My Chart to request refills or for acute issues that need immediate attention.   Please be sure medication list is accurate. If a new problem present, please set up appointment sooner than planned today.  At least 150 minutes of moderate exercise per week, daily brisk walking for 15-30 min is a good exercise option. Healthy diet low in saturated (animal) fats and sweets and consisting of fresh fruits and vegetables, lean meats such as fish and white chicken and whole grains.  These are some of recommendations for screening depending of age and risk factors:  - Vaccines:  Tdap vaccine every 10 years.  Shingles vaccine recommended at age 46, could be given after 51 years of age but not sure about insurance coverage.   Pneumonia vaccines: Pneumovax at 87. Sometimes Pneumovax is giving earlier if history of smoking, lung disease,diabetes,kidney disease among some.  Screening for diabetes at age 60 and every 3 years.  Cervical cancer prevention:  Pap smear starts at 51 years of age and continues periodically until 51 years old in low risk women. Pap smear every 3 years between 17 and 81 years old. Pap smear  every 3-5 years between women 74 and older if pap smear negative and HPV screening negative.   -Breast cancer: Mammogram: There is disagreement between experts about when to start screening in low risk asymptomatic female but recent recommendations are to start screening at 6 and not later than 51 years old , every 1-2 years and after 51 yo q 2 years. Screening is recommended until 51 years old but some women can continue screening depending of healthy issues.  Colon cancer screening: Has been recently changed to 51 yo. Insurance may not cover until you are 51 years old. Screening is recommended until 51 years old.  Cholesterol disorder screening at age 23 and every 3 years.N/A  Also recommended:  Dental visit- Brush and floss your teeth twice daily; visit your dentist twice a year. Eye doctor- Get an eye exam at least every 2 years. Helmet use- Always wear a helmet when riding a bicycle, motorcycle, rollerblading or skateboarding. Safe sex- If you may be exposed to sexually transmitted infections, use a condom. Seat belts- Seat belts can save your live; always wear one. Smoke/Carbon Monoxide detectors- These detectors need to be installed on the appropriate level of your home. Replace batteries at least once a year. Skin cancer- When out in the sun please cover up and use sunscreen 15 SPF or higher. Violence- If anyone is threatening or hurting you, please tell your healthcare provider.  Drink alcohol in moderation- Limit alcohol intake to one drink or less per day. Never drink and drive. Calcium supplementation 1000 to 1200 mg daily, ideally through your diet.  Vitamin D supplementation 800  units daily.

## 2021-01-04 NOTE — Progress Notes (Signed)
HPI: Shelly Edwards is a 50 y.o. female, who is here today for her routine physical and follow up.  Last CPE: 01/04/20  Regular exercise 3 or more time per week: Walking daily,10,000-15,000 steps. Following a healthy diet: Yes. She lives alone. Sleeping about 6-7 hours most of the time. Sleeps better when she drinks alcohol.  Chronic medical problems: vitamin D deficiency,anxiety,B12 def,alcohol dependency,and HLD among some.  Pap smear: 05/29/20  Immunization History  Administered Date(s) Administered   Influenza Inj Mdck Quad Pf 03/10/2019   Influenza, High Dose Seasonal PF 04/22/2016, 05/11/2017, 04/20/2018, 04/19/2019, 04/23/2020   Influenza-Unspecified 03/04/2017   PFIZER(Purple Top)SARS-COV-2 Vaccination 08/30/2019, 09/20/2019, 04/23/2020, 06/20/2020   Pneumococcal Polysaccharide-23 01/04/2020   Tdap 01/04/2020   Mammogram: 07/21/2018 Colonoscopy: 06/08/2019 DEXA: N/A Hep C screening: 07/06/17 non-reactive  Alcohol dependency: She tried Naltrexone and did not feel like it helped. Problem exacerbated by being at home alone. She is back to office 1-2 week. She drinks 6-8 beers through the day.  She has had abnormal LFT's. + Easy bruising, mainly upper extremities. Negative for melena,blood in stool,or gross hematuria.  Lab Results  Component Value Date   ALT 21 02/15/2020   AST 40 (H) 02/15/2020   ALKPHOS 81 11/30/2019   BILITOT 0.6 02/15/2020   Anxiety: She is not longer on Celexa, it helped some. She is planning on starting CBT through work to help with alcoholism and anxiety. There is a more comprehensive program she could try but concerned about health insurance coverage.  HLD: She is not longer taking Lopid 600 mg, difficult to swallow. No side effects.  Lab Results  Component Value Date   CHOL 280 (H) 01/11/2020   HDL 52 01/11/2020   [place]  01/11/2020     Comment:     . LDL cholesterol not calculated. Triglyceride  levels greater than 400 mg/dL invalidate calculated LDL results. . Reference range: <100 . Desirable range <100 mg/dL for primary prevention;   <70 mg/dL for patients with CHD or diabetic patients  with > or = 2 CHD risk factors. Marland Kitchen LDL-C is now calculated using the Martin-Hopkins  calculation, which is a validated novel method providing  better accuracy than the Friedewald equation in the  estimation of LDL-C.  Cresenciano Genre et al. Annamaria Helling. [id number]): 2061-2068  (http://education.[facility].com/faq/FAQ164)    TRIG 973 (H) 01/11/2020   CHOLHDL 5.4 (H) 01/11/2020   Vit D deficiency: She is taking 800 U daily. B12 deficiency: Taking daily multivitamin. She is taking Thiamine 100 mg daily.  Review of Systems  Constitutional:  Positive for fatigue. Negative for appetite change and fever.  HENT:  Negative for hearing loss, mouth sores and sore throat.   Eyes:  Negative for redness and visual disturbance.  Respiratory:  Negative for cough, shortness of breath and wheezing.   Cardiovascular:  Negative for chest pain and leg swelling.  Gastrointestinal:  Negative for abdominal pain, nausea and vomiting.       No changes in bowel habits.  Endocrine: Negative for cold intolerance, heat intolerance, polydipsia, polyphagia and polyuria.  Genitourinary:  Negative for decreased urine volume, dysuria, vaginal bleeding and vaginal discharge.  Musculoskeletal:  Negative for gait problem and myalgias.  Skin:  Negative for color change and rash.  Allergic/Immunologic: Positive for environmental allergies.  Neurological:  Negative for syncope, weakness and headaches.  Hematological:  Negative for adenopathy. Bruises/bleeds easily.  Psychiatric/Behavioral:  Positive for sleep disturbance. Negative for confusion and hallucinations. The patient is nervous/anxious.   All  other systems reviewed and are negative.  Current Outpatient Medications on File Prior to Visit  Medication Sig Dispense Refill    ADVAIR DISKUS 100-50 MCG/DOSE AEPB Inhale 1 puff into the lungs 2 (two) times daily.      Calcium Carb-Cholecalciferol (CALCIUM 600+D3 PO) Take by mouth.     Cholecalciferol (VITAMIN D PO) Take 1 tablet by mouth daily.     clobetasol (OLUX) 0.05 % topical foam APPLY TO AFFECTED AREAS OF BODY (NOT FACE) 1 2 TIMES DAILY AS NEEDED     diphenhydrAMINE (BENADRYL) 25 MG tablet Take 25 mg by mouth as needed.     fluticasone (FLONASE) 50 MCG/ACT nasal spray Place into both nostrils daily.     gemfibrozil (LOPID) 600 MG tablet TAKE 1 TABLET (600 MG TOTAL) BY MOUTH 2 (TWO) TIMES DAILY BEFORE A MEAL. 180 tablet 2   ondansetron (ZOFRAN-ODT) 8 MG disintegrating tablet TAKE 1 TABLET BY MOUTH EVERY 8 HOURS AS NEEDED FOR NAUSEA, VOMITING OR REFRACTORY NAUSEA / VOMITING. 9 tablet 2   SUMAtriptan (IMITREX) 50 MG tablet Take 1 tablet (50 mg total) by mouth daily as needed for migraine. May repeat in 2 hours if headache persists or recurs. 10 tablet 1   thiamine 100 MG tablet Take 1 tablet (100 mg total) by mouth daily. 90 tablet 3   VENTOLIN HFA 108 (90 BASE) MCG/ACT inhaler Inhale 1-2 puffs into the lungs as needed. Reported on 05/29/2015     No current facility-administered medications on file prior to visit.   Past Medical History:  Diagnosis Date   Allergic rhinitis    Allergy    Asthma    Hx of adenomatous polyp of colon 06/14/2019   Past Surgical History:  Procedure Laterality Date   AUGMENTATION MAMMAPLASTY     COLONOSCOPY  [id number]   UMBILICAL HERNIA REPAIR  2008   VAGINAL WOUND CLOSURE / REPAIR  4/10   RIGHT    Allergies  Allergen Reactions   Molds & Smuts Itching and Shortness Of Breath    Family History  Problem Relation Age of Onset   Hypertension Mother    Colon polyps Mother        pre-cancerous polyps   Hypertension Father    Heart disease Father    Prostate cancer Father    Colon polyps Father        pre-cancerous polyps   Breast cancer Maternal Grandmother    Colon cancer  Paternal Grandmother 38   Kidney disease Neg Hx    Gallbladder disease Neg Hx    Esophageal cancer Neg Hx    Stomach cancer Neg Hx    Rectal cancer Neg Hx     Social History   Socioeconomic History   Marital status: Single    Spouse name: Not on file   Number of children: 2   Years of education: Not on file   Highest education level: Not on file  Occupational History   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[employer]  Tobacco Use   Smoking status: Former    Types: Cigarettes    Quit date: 05/28/1987    Years since quitting: 33.6   Smokeless tobacco: Never  Vaping Use   Vaping Use: Never used  Substance and Sexual Activity   Alcohol use: Yes    Alcohol/week: 0.0 standard drinks    Comment: occ   Drug use: No   Sexual activity: Yes    Comment: patients husband with vasectomy  Other Topics Concern  Not on file  Social History Narrative   Divorced, 2 sons   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   Social Determinants of Health   Financial Resource Strain: Not on file  Food Insecurity: Not on file  Transportation Needs: Not on file  Physical Activity: Not on file  Stress: Not on file  Social Connections: Not on file   Vitals:   01/04/21 1033  BP: 124/70  Pulse: 78  Resp: 12  SpO2: 98%   Body mass index is 21.83 kg/m.  Wt Readings from Last 3 Encounters:  01/04/21 119 lb 6 oz (54.1 kg)  07/03/20 118 lb (53.5 kg)  05/29/20 110 lb (49.9 kg)   Physical Exam Vitals and nursing note reviewed.  Constitutional:      General: She is not in acute distress.    Appearance: She is well-developed.  HENT:     Head: Normocephalic and atraumatic.     Right Ear: Hearing, tympanic membrane, ear canal and external ear normal.     Left Ear: Hearing, tympanic membrane, ear canal and external ear normal.     Mouth/Throat:     Mouth: Mucous membranes are moist.     Pharynx: Oropharynx is clear. Uvula midline.  Eyes:     Extraocular Movements:  Extraocular movements intact.     Conjunctiva/sclera: Conjunctivae normal.     Pupils: Pupils are equal, round, and reactive to light.  Neck:     Thyroid: No thyromegaly.     Trachea: No tracheal deviation.  Cardiovascular:     Rate and Rhythm: Normal rate and regular rhythm.     Pulses:          Dorsalis pedis pulses are 2+ on the right side and 2+ on the left side.     Heart sounds: No murmur heard. Pulmonary:     Effort: Pulmonary effort is normal. No respiratory distress.     Breath sounds: Normal breath sounds.  Chest:  Breasts:    Right: No supraclavicular adenopathy.     Left: No supraclavicular adenopathy.  Abdominal:     Palpations: Abdomen is soft. There is no hepatomegaly or mass.     Tenderness: There is no abdominal tenderness.  Genitourinary:    Comments: Deferred to gyn. Musculoskeletal:     Comments: No major deformity or signs of synovitis appreciated.  Lymphadenopathy:     Cervical: No cervical adenopathy.     Upper Body:     Right upper body: No supraclavicular adenopathy.     Left upper body: No supraclavicular adenopathy.  Skin:    General: Skin is warm.     Findings: Ecchymosis (Scattered on upper extremities, forearms mainly) present. No erythema or rash.     Comments: .  Neurological:     General: No focal deficit present.     Mental Status: [name], place, and time.     Cranial Nerves: No cranial nerve deficit.     Coordination: Coordination normal.     Gait: Gait normal.     Deep Tendon Reflexes:     Reflex Scores:      Bicep reflexes are 2+ on the right side and 2+ on the left side.      Patellar reflexes are 2+ on the right side and 2+ on the left side. Psychiatric:        Speech: Speech normal.     Comments: Well groomed, good eye contact.   ASSESSMENT AND  PLAN:  Shelly Edwards was here today annual physical examination.  Orders Placed This Encounter  Procedures   CBC with  Differential/Platelet   Basic metabolic panel   Hepatic function panel   Hemoglobin A1c   Lipid panel   Vitamin B12   VITAMIN D 25 Hydroxy (Vit-D Deficiency, Fractures)   TSH   Lab Results  Component Value Date   TSH 0.88 01/04/2021   Lab Results  Component Value Date   WBC 5.5 01/04/2021   HGB 12.5 01/04/2021   HCT 37.5 01/04/2021   MCV 95.9 01/04/2021   PLT 259.0 01/04/2021   Lab Results  Component Value Date   CREATININE 0.71 01/04/2021   BUN 10 01/04/2021   NA 137 01/04/2021   K 4.4 01/04/2021   CL 101 01/04/2021   CO2 24 01/04/2021   Lab Results  Component Value Date   ALT 17 01/04/2021   AST 30 01/04/2021   ALKPHOS 62 01/04/2021   BILITOT 0.6 01/04/2021   Lab Results  Component Value Date   VITAMINB12 246 01/04/2021   Routine general medical examination at a health care facility We discussed the importance of regular physical activity and healthy diet for prevention of chronic illness and/or complications. Preventive guidelines reviewed. Vaccination up to date. Ca++ and vit D supplementation to continue. Next CPE in a year.  The ASCVD Risk score Mikey Bussing DC Jr., et al., 2013) failed to calculate for the following reasons:   The valid HDL cholesterol range is 20 to 100 mg/dL  Vitamin D deficiency, unspecified Continue current dose of vit D, dose will be adjusted according to 25 [place] vit D result.  B12 deficiency Continue current B12 dose.  Pure hypertriglyceridemia Non pharmacologic treatment to continue for now. Further recommendations will be given according to 10 years CVD risk score and lipid panel numbers.  Screening for endocrine, metabolic and immunity disorder -     Hemoglobin A1c -     Basic metabolic panel  Alcohol use disorder, moderate, dependence ([place]) She has recognized problem and planning on starting CBT. I recommend considering a more multidisciplinary program, which she has explored already but not sure about insurance coverage. She  is aware of adverse effects of high alcohol intake. She does not feel like AA meeting will help. Trazodone may help.  -     traZODone (DESYREL) 50 MG tablet; Take 0.5-1 tablets (25-50 mg total) by mouth at bedtime.  Insomnia, unspecified type Good sleep hygiene. Trazodone 25 mg at bedtime.  -     traZODone (DESYREL) 50 MG tablet; Take 0.5-1 tablets (25-50 mg total) by mouth at bedtime.   Return in about 3 months (around 04/06/2021).  Courtny Bennison G. Martinique, MD  [facility]. [place] office.

## 2021-01-09 ENCOUNTER — Ambulatory Visit (INDEPENDENT_AMBULATORY_CARE_PROVIDER_SITE_OTHER): Payer: 59

## 2021-01-09 ENCOUNTER — Other Ambulatory Visit: Payer: Self-pay

## 2021-01-09 DIAGNOSIS — E538 Deficiency of other specified B group vitamins: Secondary | ICD-10-CM | POA: Diagnosis not present

## 2021-01-09 MED ORDER — CYANOCOBALAMIN 1000 MCG/ML IJ SOLN
1000.0000 ug | Freq: Once | INTRAMUSCULAR | Status: AC
  Administered 2021-01-09: 1000 ug via INTRAMUSCULAR

## 2021-01-09 NOTE — Patient Instructions (Signed)
Health Maintenance Due  Topic Date Due   MAMMOGRAM  07/21/2020   INFLUENZA VACCINE  01/07/2021    Depression screen [facility] 2/9 01/04/2021 12/04/2019  Decreased Interest 0 1  Down, Depressed, Hopeless 0 1  PHQ - 2 Score 0 2  Altered sleeping - 1  Tired, decreased energy - 2  Change in appetite - 2  Feeling bad or failure about yourself  - 1  Trouble concentrating - 1  Moving slowly or fidgety/restless - 0  Suicidal thoughts - 1  PHQ-9 Score - 10  Difficult doing work/chores - Somewhat difficult

## 2021-01-09 NOTE — Progress Notes (Signed)
Per orders of Martinique, Betty G, MD, injection of B12 given in right deltoid by Franco Collet. Patient tolerated injection well.  Lab Results  Component Value Date   VITAMINB12 246 01/04/2021

## 2021-01-16 ENCOUNTER — Ambulatory Visit (INDEPENDENT_AMBULATORY_CARE_PROVIDER_SITE_OTHER): Payer: 59

## 2021-01-16 ENCOUNTER — Other Ambulatory Visit: Payer: Self-pay

## 2021-01-16 DIAGNOSIS — E538 Deficiency of other specified B group vitamins: Secondary | ICD-10-CM

## 2021-01-16 MED ORDER — CYANOCOBALAMIN 1000 MCG/ML IJ SOLN
1000.0000 ug | Freq: Once | INTRAMUSCULAR | Status: AC
  Administered 2021-01-16: 1000 ug via INTRAMUSCULAR

## 2021-01-16 NOTE — Progress Notes (Signed)
Per orders of Dr. Jordan, injection of Cyanocobalamin 1000 mcg given by Amarachi Kotz L Talin Feister. Patient tolerated injection well.  

## 2021-01-24 ENCOUNTER — Ambulatory Visit: Payer: 59

## 2021-01-25 ENCOUNTER — Ambulatory Visit (INDEPENDENT_AMBULATORY_CARE_PROVIDER_SITE_OTHER): Payer: 59

## 2021-01-25 ENCOUNTER — Other Ambulatory Visit: Payer: Self-pay

## 2021-01-25 DIAGNOSIS — E538 Deficiency of other specified B group vitamins: Secondary | ICD-10-CM | POA: Diagnosis not present

## 2021-01-25 MED ORDER — CYANOCOBALAMIN 1000 MCG/ML IJ SOLN
1000.0000 ug | Freq: Once | INTRAMUSCULAR | Status: AC
  Administered 2021-01-25: 1000 ug via INTRAMUSCULAR

## 2021-01-25 NOTE — Progress Notes (Signed)
Per orders of Cory Nafziger NP, injection of Cyanocobalamin 1000 mcg given by Zakiyyah Savannah L Ayen Viviano. Patient tolerated injection well.  

## 2021-01-30 ENCOUNTER — Other Ambulatory Visit: Payer: Self-pay

## 2021-01-30 ENCOUNTER — Ambulatory Visit (INDEPENDENT_AMBULATORY_CARE_PROVIDER_SITE_OTHER): Payer: 59

## 2021-01-30 DIAGNOSIS — E538 Deficiency of other specified B group vitamins: Secondary | ICD-10-CM

## 2021-01-30 DIAGNOSIS — G4700 Insomnia, unspecified: Secondary | ICD-10-CM

## 2021-01-30 DIAGNOSIS — F1020 Alcohol dependence, uncomplicated: Secondary | ICD-10-CM

## 2021-01-30 MED ORDER — TRAZODONE HCL 50 MG PO TABS: 25.0000 mg | ORAL_TABLET | Freq: Every day | ORAL | 1 refills | Status: DC

## 2021-01-30 MED ORDER — CYANOCOBALAMIN 1000 MCG/ML IJ SOLN
1000.0000 ug | Freq: Once | INTRAMUSCULAR | Status: AC
  Administered 2021-01-30: 1000 ug via INTRAMUSCULAR

## 2021-01-30 NOTE — Progress Notes (Signed)
Per orders of Dr. Jordan, injection of B12 given by Shawnya Mayor E Chrissy Ealey. Patient tolerated injection well.  

## 2021-02-04 ENCOUNTER — Ambulatory Visit: Payer: 59 | Admitting: Family Medicine

## 2021-02-04 ENCOUNTER — Other Ambulatory Visit: Payer: Self-pay

## 2021-02-04 ENCOUNTER — Encounter: Payer: Self-pay | Admitting: Family Medicine

## 2021-02-04 ENCOUNTER — Other Ambulatory Visit: Payer: Self-pay | Admitting: Internal Medicine

## 2021-02-04 VITALS — BP 120/70 | HR 92 | Resp 16 | Ht 62.0 in | Wt 121.0 lb

## 2021-02-04 DIAGNOSIS — L301 Dyshidrosis [pompholyx]: Secondary | ICD-10-CM | POA: Diagnosis not present

## 2021-02-04 DIAGNOSIS — R21 Rash and other nonspecific skin eruption: Secondary | ICD-10-CM

## 2021-02-04 MED ORDER — PREDNISONE 20 MG PO TABS: 40.0000 mg | ORAL_TABLET | Freq: Every day | ORAL | 0 refills | Status: AC

## 2021-02-04 NOTE — Patient Instructions (Addendum)
A few things to remember from today's visit:  Eczema, dyshidrotic - Plan: Ambulatory referral to Dermatology  Rash/skin eruption - Plan: predniSONE (DELTASONE) 20 MG tablet, Ambulatory referral to Dermatology  Small amount of clobetasol at the time. Prednisone with food.  If you need refills please call your pharmacy. Do not use My Chart to request refills or for acute issues that need immediate attention.    Please be sure medication list is accurate. If a new problem present, please set up appointment sooner than planned today.

## 2021-02-04 NOTE — Progress Notes (Signed)
ACUTE VISIT Chief Complaint  Patient presents with   skin issues   HPI: [name] is a 50 y.o. female, who is here today complaining of pruritic skin lesions noted 1-2 months ago, mainly affecting LE's. Very pruritic "bumps" on fingers,palms,and feet. She has not identified exacerbating or alleviating factors.  Rash This is a new problem. The current episode started more than 1 month ago. The rash is diffuse. She was exposed to nothing. Pertinent negatives include no congestion, cough, facial edema, fever, joint pain, nail changes, rhinorrhea, shortness of breath, sore throat or vomiting. Past treatments include topical steroids. The treatment provided no relief. Her past medical history is significant for allergies and asthma.  Negative for new medication, detergent, soap, or body product. No known insect bite or outdoor exposures to plants. No sick contact.  She has hx of eczema , so she has other pruritic rash intermittently for years. Topical clobetasol and triamcinolone not longer helping. Her dermatologist is far from home, she would like to establish with a local provider.  Ecchymosis left inner thigh after being hit with a ball when playing soccer. Easy brushing. She has not noted blood in stool,melena,or nose/gum bleeding. Lab Results  Component Value Date   WBC 5.5 01/04/2021   HGB 12.5 01/04/2021   HCT 37.5 01/04/2021   MCV 95.9 01/04/2021   PLT 259.0 01/04/2021   Review of Systems  Constitutional:  Negative for appetite change, chills and fever.  HENT:  Negative for congestion, mouth sores, rhinorrhea and sore throat.   Eyes:  Negative for discharge and redness.  Respiratory:  Negative for cough, shortness of breath and wheezing.   Cardiovascular:  Negative for leg swelling.  Gastrointestinal:  Negative for abdominal pain and vomiting.       Negative for changes in bowel habits.  Musculoskeletal:  Negative for joint pain, joint swelling  and myalgias.  Skin:  Positive for rash. Negative for nail changes and wound.  Allergic/Immunologic: Positive for environmental allergies.  Neurological:  Negative for weakness, numbness and headaches.  Hematological:  Negative for adenopathy. Bruises/bleeds easily.  Rest see pertinent positives and negatives per HPI.  Current Outpatient Medications on File Prior to Visit  Medication Sig Dispense Refill   ADVAIR DISKUS 100-50 MCG/DOSE AEPB Inhale 1 puff into the lungs 2 (two) times daily.      Calcium Carb-Cholecalciferol (CALCIUM 600+D3 PO) Take by mouth.     Cholecalciferol (VITAMIN D PO) Take 1 tablet by mouth daily.     clobetasol (OLUX) 0.05 % topical foam APPLY TO AFFECTED AREAS OF BODY (NOT FACE) 1 2 TIMES DAILY AS NEEDED     diphenhydrAMINE (BENADRYL) 25 MG tablet Take 25 mg by mouth as needed.     fluticasone (FLONASE) 50 MCG/ACT nasal spray Place into both nostrils daily.     gemfibrozil (LOPID) 600 MG tablet TAKE 1 TABLET (600 MG TOTAL) BY MOUTH 2 (TWO) TIMES DAILY BEFORE A MEAL. 180 tablet 2   SUMAtriptan (IMITREX) 50 MG tablet Take 1 tablet (50 mg total) by mouth daily as needed for migraine. May repeat in 2 hours if headache persists or recurs. 10 tablet 1   thiamine 100 MG tablet Take 1 tablet (100 mg total) by mouth daily. 90 tablet 3   traZODone (DESYREL) 50 MG tablet Take 0.5-1 tablets (25-50 mg total) by mouth at bedtime. 90 tablet 1   VENTOLIN HFA 108 (90 BASE) MCG/ACT inhaler Inhale 1-2 puffs into the lungs as needed. Reported on  05/29/2015     No current facility-administered medications on file prior to visit.   Past Medical History:  Diagnosis Date   Allergic rhinitis    Allergy    Asthma    Hx of adenomatous polyp of colon 06/14/2019   Allergies  Allergen Reactions   Molds & Smuts Itching and Shortness Of Breath   Social History   Socioeconomic History   Marital status: Single    Spouse name: Not on file   Number of children: 2   Years of education: Not  on file   Highest education level: Not on file  Occupational History   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[employer]  Tobacco Use   Smoking status: Former    Types: Cigarettes    Quit date: 05/28/1987    Years since quitting: 33.7   Smokeless tobacco: Never  Vaping Use   Vaping Use: Never used  Substance and Sexual Activity   Alcohol use: Yes    Alcohol/week: 0.0 standard drinks    Comment: occ   Drug use: No   Sexual activity: Yes    Comment: patients husband with vasectomy  Other Topics Concern   Not on file  Social History Narrative   Divorced, 2 sons   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  Physical Exam Vitals and nursing note reviewed.  Constitutional:      General: She is not in acute distress.    Appearance: She is well-developed and normal weight.  HENT:     Head: Normocephalic and atraumatic.     Mouth/Throat:     Mouth: Mucous membranes are moist.     Pharynx: Oropharynx is clear.  Eyes:     Conjunctiva/sclera: Conjunctivae normal.  Cardiovascular:     Rate and Rhythm: Normal rate and regular rhythm.  Pulmonary:     Effort: Pulmonary effort is normal. No respiratory distress.     Breath sounds: Normal breath sounds.  Musculoskeletal:     Right lower leg: No edema.     Left lower leg: No edema.  Lymphadenopathy:     Cervical: No cervical adenopathy.  Skin:    General: Skin is warm.     Findings: Ecchymosis (Forearms adn left inner thigh.) and rash present. Rash is papular.          Comments: 1 cm papular erythematous lesions scattered on LE's. Some scratching signs.  Micropapular and  scaly confluent areas on some fingers ,around great toes,and heel, bilateral. Flexure surfaces upper and lower extremities with micropapular erythematous rash, confluent.  Neurological:     Mental Status: [name], place, and time.  Psychiatric:     Comments: Well groomed, good eye contact.   ASSESSMENT AND PLAN:  Shelly Edwards was seen today for skin issues.  Diagnoses and all orders for this visit: Orders Placed This Encounter  Procedures   Ambulatory referral to Dermatology   Eczema, dyshidrotic We discussed Dx,prognosis,and treatment options. Continue clobetasol cream, small amount at bedtime on feet and fingers, put soaks and cotton gloves on  after application. Dermatology referral placed.  Rash/skin eruption Some are eczema related. Papular lesions on LE's are not typical for eczema and new. Possible etiologies discussed, ? Insect bite. Hx and examination do not suggest a serious process. She has taken Prednisone in the past and has been well tolerated. After discussion of some side effects, she agrees with short course of Prednisone. Monitor for new symptoms.  -     predniSONE (DELTASONE) 20 MG tablet; Take 2 tablets (40 mg total) by mouth daily with breakfast for 5 days.  Return if symptoms worsen or fail to improve.   Celie Desrochers G. Martinique, MD  [facility] [place] office.

## 2021-02-07 ENCOUNTER — Encounter: Payer: Self-pay | Admitting: Family Medicine

## 2021-02-15 ENCOUNTER — Telehealth: Payer: Self-pay

## 2021-02-15 NOTE — Telephone Encounter (Signed)
Patient called requesting a Rx refill of predniSONE (DELTASONE) 20 MG tablet Patient stated she is having some of the same symptoms.

## 2021-02-19 NOTE — Telephone Encounter (Signed)
I called and left patient a detailed message per her dpr with the information below. I advised her to call back if she sees/feels no improvement.

## 2021-02-19 NOTE — Telephone Encounter (Signed)
Because risk of side effects we do not recommend treatments longer than 5-7 days, it is intended for short period of time.Recommend continuing with topical steroid cream. Can add Zyrtec 10 mg and Pepcid 20 mg bid. Thanks, BJ

## 2021-05-30 ENCOUNTER — Ambulatory Visit: Payer: Self-pay | Admitting: Obstetrics & Gynecology

## 2021-06-20 ENCOUNTER — Other Ambulatory Visit: Payer: Self-pay | Admitting: Internal Medicine

## 2021-06-20 ENCOUNTER — Other Ambulatory Visit: Payer: Self-pay | Admitting: Family Medicine

## 2021-06-20 DIAGNOSIS — F1020 Alcohol dependence, uncomplicated: Secondary | ICD-10-CM

## 2021-09-02 ENCOUNTER — Other Ambulatory Visit: Payer: Self-pay | Admitting: Internal Medicine

## 2021-09-24 ENCOUNTER — Other Ambulatory Visit: Payer: Self-pay | Admitting: Internal Medicine

## 2021-10-23 ENCOUNTER — Telehealth: Payer: Self-pay | Admitting: Internal Medicine

## 2021-10-23 MED ORDER — ONDANSETRON 8 MG PO TBDP: ORAL_TABLET | ORAL | 0 refills | Status: DC

## 2021-10-23 NOTE — Telephone Encounter (Signed)
Ondansetron refilled and patient aware to call and make appointment.  ?

## 2021-10-23 NOTE — Telephone Encounter (Signed)
Patient called regarding a refill prescription for Ondansetron. Patient states she called pharmacy to have prescription refilled and pharmacy informed her she cannot have the prescription refilled. Patient is aware that she needs an ov to refill prescription. Patient is going out of town on 10/29/21 and would like to have prescription refilled before she goes out of town. ?

## 2021-11-11 NOTE — Progress Notes (Signed)
ACUTE VISIT Chief Complaint  Patient presents with   Knee Pain    Right, swollen & red, since Sunday morning. No history of gout.    HPI: [name] is a 51 y.o. female, who is here today complaining of right knee pain as described above. No hx of trauma. Saturday night she had mild achy and next day she woke up with moderate to severe pain, local heat,edema,and erythema. Pain is exacerbated by walking/movement and alleviated by rest.  Erythema is extending to pretibial area, which is also tender with palpation. This a new problem. No hx of gout.  Knee Pain  The incident occurred 3 to 5 days ago. There was no injury mechanism. The pain is present in the right knee. The quality of the pain is described as aching. The pain is moderate. The pain has been Improving since onset. Associated symptoms include a loss of motion. Pertinent negatives include no inability to bear weight, loss of sensation, muscle weakness, numbness or tingling. The symptoms are aggravated by movement. She has tried NSAIDs for the symptoms. The treatment provided mild relief.   Negative for fever, chills, body aches,or unusual fatigue. She has taken Ibuprofen. 6 weeks ago posterior knee and thigh pain, she thinks she may have pull her hamstring, improving.  Planning on leaving town tonight, going to [place].  Hx of alcohol dependency, LFT's has been mildly abnormal in the past. She has not noted abdominal pain,N/V,changes in bowel habits,or jaundice.  Review of Systems  Constitutional:  Positive for activity change. Negative for appetite change.  Respiratory:  Negative for cough, shortness of breath and wheezing.   Cardiovascular:  Negative for palpitations and leg swelling.  Genitourinary:  Negative for decreased urine volume, dysuria and hematuria.  Musculoskeletal:  Negative for back pain and neck pain.  Skin:  Negative for wound.  Neurological:  Negative for tingling,  weakness and numbness.  Rest see pertinent positives and negatives per HPI.  Current Outpatient Medications on File Prior to Visit  Medication Sig Dispense Refill   ADVAIR DISKUS 100-50 MCG/DOSE AEPB Inhale 1 puff into the lungs 2 (two) times daily.      Calcium Carb-Cholecalciferol (CALCIUM 600+D3 PO) Take by mouth.     Cholecalciferol (VITAMIN D PO) Take 1 tablet by mouth daily.     clobetasol (OLUX) 0.05 % topical foam APPLY TO AFFECTED AREAS OF BODY (NOT FACE) 1 2 TIMES DAILY AS NEEDED     diphenhydrAMINE (BENADRYL) 25 MG tablet Take 25 mg by mouth as needed.     fluticasone (FLONASE) 50 MCG/ACT nasal spray Place into both nostrils daily.     ondansetron (ZOFRAN-ODT) 8 MG disintegrating tablet TAKE 1 TABLET BY MOUTH EVERY 8 HOURS AS NEEDED FOR NAUSEA, VOMITING OR REFRACTORY NAUSEA / VOMITING. 9 tablet 0   SUMAtriptan (IMITREX) 50 MG tablet Take 1 tablet (50 mg total) by mouth daily as needed for migraine. May repeat in 2 hours if headache persists or recurs. 10 tablet 1   thiamine 100 MG tablet Take 1 tablet (100 mg total) by mouth daily. 90 tablet 3   VENTOLIN HFA 108 (90 BASE) MCG/ACT inhaler Inhale 1-2 puffs into the lungs as needed. Reported on 05/29/2015     No current facility-administered medications on file prior to visit.   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[employer]  Tobacco Use   Smoking status: Former    Types: Cigarettes    Quit date: 05/28/1987    Years since quitting: 34.4   Smokeless tobacco: Never  Vaping Use    Vaping Use: Never used  Substance and Sexual Activity   Alcohol use: Yes    Alcohol/week: 0.0 standard drinks of alcohol    Comment: occ   Drug use: No   Sexual activity: Yes    Comment: patients husband with vasectomy  Other Topics Concern   Not on file  Social History Narrative   Divorced, 2 sons   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  Physical Exam Vitals and nursing note reviewed.  Constitutional:      General: She is not in acute distress.    Appearance: She is well-developed and normal weight. She is not ill-appearing.  HENT:     Head: Normocephalic and atraumatic.  Eyes:     Conjunctiva/sclera: Conjunctivae normal.  Cardiovascular:     Rate and Rhythm: Normal rate and regular rhythm.     Pulses:          Dorsalis pedis pulses are 2+ on the right side and 2+ on the left side.     Heart sounds: No murmur heard.    Comments: No calf tenderness with palpation of right foot dorsi flexion. Pulmonary:     Effort: Pulmonary effort is normal. No respiratory distress.     Breath sounds: Normal breath sounds.  Musculoskeletal:     Right knee: Swelling and erythema present. No deformity or bony tenderness. Decreased range of motion. Tenderness present. No patellar tendon tenderness.     Comments: Right knee: on inspection prepatellar effusion, erythema, and local heat.  Slight erythema extending from anterior aspect of knee to proximal pretibial area.  Skin:    General: Skin is warm.     Findings: No erythema or rash.  Neurological:     Mental Status: [name], place, and time.     Comments: Antalgic gait,  not assisted.  Psychiatric:     Comments: Well groomed, good eye contact.   ASSESSMENT AND PLAN:  [name] was seen today for knee pain.  Diagnoses and all orders for this visit: Orders Placed This Encounter  Procedures   DG Knee Complete 4 Views Right   CBC with Differential/Platelet   Uric acid   Basic metabolic panel   Hepatic function panel   C-reactive protein   Lab Results  Component Value Date   CRP 10.9 11/12/2021   Lab Results  Component Value Date   WBC 10.0 11/12/2021   HGB 13.0 11/12/2021  HCT 39.2 11/12/2021   MCV 95.5 11/12/2021   PLT 244.0 11/12/2021   Lab Results  Component Value Date   CREATININE 0.63 11/12/2021   BUN 7 11/12/2021   NA 137 11/12/2021   K 4.3 11/12/2021   CL 103 11/12/2021   CO2 24 11/12/2021   Lab Results  Component Value Date   ALT 11 11/12/2021   AST 18 11/12/2021   ALKPHOS 93 11/12/2021   BILITOT 0.4 11/12/2021   Acute pain of right knee We discussed possible causes, gout,septic arthritis,and bursitis among some. It is improving some and she has no systemic symptoms nor hx of skin trauma. Knee X ray and labs done today. Will cover for infectious process with cephalexin. Clearly instructed about warning signs.  Prepatellar bursitis of right knee We discussed Dx and treatment options. If labs and X ray are otherwise normal, she will be instructed to start oral Prednisone. Some side effects discussed.  -     cephALEXin (KEFLEX) 500 MG capsule; Take 1 capsule (500 mg total) by mouth 3 (three) times daily for 10 days. -     predniSONE (DELTASONE) 20 MG tablet; Take 2 tablets (40 mg total) by mouth daily with breakfast for 5 days.  Elevated transaminase level Caution with NSAID's. Continue working on decreasing alcohol intake. Further recommendations according to lab results.  Return if symptoms worsen or fail to improve.  Iasha Mccalister G. Martin[place], MD  [facility]. [place] office.

## 2021-11-12 ENCOUNTER — Encounter: Payer: Self-pay | Admitting: Family Medicine

## 2021-11-12 ENCOUNTER — Ambulatory Visit: Payer: 59 | Admitting: Family Medicine

## 2021-11-12 ENCOUNTER — Ambulatory Visit (INDEPENDENT_AMBULATORY_CARE_PROVIDER_SITE_OTHER): Payer: 59

## 2021-11-12 VITALS — BP 118/70 | HR 93 | Resp 16 | Ht 62.0 in | Wt 126.0 lb

## 2021-11-12 DIAGNOSIS — M7041 Prepatellar bursitis, right knee: Secondary | ICD-10-CM | POA: Diagnosis not present

## 2021-11-12 DIAGNOSIS — R7401 Elevation of levels of liver transaminase levels: Secondary | ICD-10-CM | POA: Diagnosis not present

## 2021-11-12 DIAGNOSIS — M25561 Pain in right knee: Secondary | ICD-10-CM

## 2021-11-12 MED ORDER — CEPHALEXIN 500 MG PO CAPS: 500.0000 mg | ORAL_CAPSULE | Freq: Three times a day (TID) | ORAL | 0 refills | Status: AC

## 2021-11-12 MED ORDER — PREDNISONE 20 MG PO TABS: 40.0000 mg | ORAL_TABLET | Freq: Every day | ORAL | 0 refills | Status: AC

## 2021-11-12 NOTE — Patient Instructions (Signed)
A few things to remember from today's visit:  Acute pain of right knee - Plan: CBC with Differential/Platelet, Uric acid, DG Knee Complete 4 Views Right, C-reactive protein  Prepatellar bursitis of right knee  Elevated transaminase level - Plan: Basic metabolic panel, Hepatic function panel  If you need refills please call your pharmacy. Do not use My Chart to request refills or for acute issues that need immediate attention.    Please be sure medication list is accurate. If a new problem present, please set up appointment sooner than planned today.  Iliotibial Bursitis  Iliotibial bursitis is the swelling of the fluid-filled sac (bursa) in the knee. A bursa acts as a cushion and protects the joint. If the bursa becomes irritated, it can fill with extra fluid and become inflamed. The iliotibial bursa is located beneath a long tendon (iliotibial band) that connects muscles of the buttock, hip, and upper leg to the outside of the shin bone. This condition is also called iliotibial band friction syndrome. What are the causes? This condition is caused by repeated rubbing of the tendon over the bursa. This occurs with repeated activities. This friction causes fluid to build up inside the bursa. The buildup of fluid inside the bursa causes it to swell. The swollen bursa causes pain in the area where it is located. What increases the risk? The following factors may make you more likely to develop this condition: Participating in athletic activities that include repetitive motion, such as running, cycling, or soccer. Doing athletic activities that involve squatting, side-to-side movements, or cutting maneuvers. Overtraining, or starting a new athletic activity without gradually increasing your time and distance. Being 38-46 years old and having knee arthritis. Being a middle-aged woman who is overweight. Having flat feet or knee deformities. Having diabetes. What are the signs or  symptoms? Symptoms of this condition include: Pain on the outside of your knee and areas surrounding the knee, such as the thigh. Tenderness when pressing on the side of your knee. Knee swelling that may or may not include increased warmth or redness. Pain that gets worse with activity such as: Kneeling. Walking down the stairs. Prolonged walking or running. How is this diagnosed? This condition is usually diagnosed based on your symptoms, your medical history, and a physical exam. During the exam, your health care provider will check your: Knee motion. Knee strength. Amount of pain when the outside of your knee is touched or pressed on. Ability to do activities such as walking or climbing stairs. Rarely, other tests may be done to rule out other causes of your symptoms. These tests may include: MRI. Ultrasound. How is this treated? Treatment for this condition may include: Avoiding activities that cause pain and swelling. Icing your knee. Applying heat to your knee. Wearing an elastic wrap or compression knee sleeve to support your knee. Keeping your knee raised (elevated) when resting. Taking medicine to reduce pain and swelling. Having an injection of numbing medicine or anti-inflammatory medicine (steroid) into the bursa to see if the pain will go away. Doing stretching and strengthening exercises (physical therapy). Treatment usually improves the pain in 6-8 weeks. Surgery is rarely needed to drain or remove the bursa. Follow these instructions at home: If you have a removable compression wrap or sleeve: Wear it as told by your health care provider. Remove it only as told by your health care provider. Loosen the wrap or sleeve if your foot or toes tingle, become numb, or turn cold and blue. Keep the  wrap or sleeve clean. If the wrap or sleeve is not waterproof: Remove it if allowed by your health care provider. Do not let it get wet. Cover it with a watertight covering when  you take a bath or shower if you must wear it. Managing pain, stiffness, and swelling     If directed, put ice on the knee. If you have a removable wrap or sleeve, remove it as told by your health care provider. Put ice in a plastic bag. Place a towel between your skin and the bag. Leave the ice on for 20 minutes, 2-3 times a day. Remove the ice if your skin turns bright red. This is very important. If you cannot feel pain, heat, or cold, you have a greater risk of damage to the area. Move your toes often to reduce stiffness and swelling. Elevate your leg above the level of your heart while you are sitting or lying down. If directed, apply heat to the affected area. Use the heat source that your health care provider recommends, such as a moist heat pack or a heating pad. Place a towel between your skin and the heat source. Leave the heat on for 20-30 minutes. Remove the heat if your skin turns bright red. This is especially important if you are unable to feel pain, heat, or cold. You may have a greater risk of getting burned. Activity Return to your normal activities as told by your health care provider. Ask your health care provider what activities are safe for you. Do exercises as told by your health care provider and physical therapist. General instructions Take over-the-counter and prescription medicines only as told by your health care provider. Sleep with a pillow between your knees. Do not use any products that contain nicotine or tobacco. These products include cigarettes, chewing tobacco, and vaping devices, such as e-cigarettes. These can delay healing. If you need help quitting, ask your health care provider. If you are overweight, work with your health care provider and a dietitian to set a weight-loss goal that is healthy and reasonable for you. Keep all follow-up visits. This is important. How is this prevented? Warm up and stretch before being active. Cool down and stretch  after being active. Give your body time to rest between periods of activity. Use equipment that fits you. Maintain physical fitness, including: Strength. Flexibility. Contact a health care provider if: You have pain that is not relieved by rest or treatment. Your symptoms get worse or do not improve with home care. Summary Iliotibial bursitis is the swelling of the fluid-filled sac (bursa) in the knee. Symptoms of this condition include pain on the outside of the knee that gets worse with activity. Treatment includes resting the knee, ice, compression, and sometimes pain medicines. This information is not intended to replace advice given to you by your health care provider. Make sure you discuss any questions you have with your health care provider. Document Revised: 05/21/2021 Document Reviewed: 05/21/2021 Elsevier Patient Education  [place].

## 2021-11-13 LAB — CBC WITH DIFFERENTIAL/PLATELET
Basophils Absolute: 0.1 10*3/uL (ref 0.0–0.1)
Basophils Relative: 1.4 % (ref 0.0–3.0)
Eosinophils Absolute: 0.1 10*3/uL (ref 0.0–0.7)
Eosinophils Relative: 0.6 % (ref 0.0–5.0)
HCT: 39.2 % (ref 36.0–46.0)
Hemoglobin: 13 g/dL (ref 12.0–15.0)
Lymphocytes Relative: 15.2 % (ref 12.0–46.0)
Lymphs Abs: 1.5 10*3/uL (ref 0.7–4.0)
MCHC: 33.1 g/dL (ref 30.0–36.0)
MCV: 95.5 fl (ref 78.0–100.0)
Monocytes Absolute: 1 10*3/uL (ref 0.1–1.0)
Monocytes Relative: 9.6 % (ref 3.0–12.0)
Neutro Abs: 7.3 10*3/uL (ref 1.4–7.7)
Neutrophils Relative %: 73.2 % (ref 43.0–77.0)
Platelets: 244 10*3/uL (ref 150.0–400.0)
RBC: 4.1 Mil/uL (ref 3.87–5.11)
RDW: 13.7 % (ref 11.5–15.5)
WBC: 10 10*3/uL (ref 4.0–10.5)

## 2021-11-13 LAB — BASIC METABOLIC PANEL
BUN: 7 mg/dL (ref 6–23)
CO2: 24 mEq/L (ref 19–32)
Calcium: 9.2 mg/dL (ref 8.4–10.5)
Chloride: 103 mEq/L (ref 96–112)
Creatinine, Ser: 0.63 mg/dL (ref 0.40–1.20)
GFR: 102.63 mL/min (ref >60.00)
Glucose, Bld: 70 mg/dL (ref 70–99)
Potassium: 4.3 mEq/L (ref 3.5–5.1)
Sodium: 137 mEq/L (ref 135–145)

## 2021-11-13 LAB — HEPATIC FUNCTION PANEL
ALT: 11 U/L (ref 0–35)
AST: 18 U/L (ref 0–37)
Albumin: 3.8 g/dL (ref 3.5–5.2)
Alkaline Phosphatase: 93 U/L (ref 39–117)
Bilirubin, Direct: 0.2 mg/dL (ref 0.0–0.3)
Total Bilirubin: 0.4 mg/dL (ref 0.2–1.2)
Total Protein: 7.1 g/dL (ref 6.0–8.3)

## 2021-11-13 LAB — C-REACTIVE PROTEIN: CRP: 10.9 mg/dL (ref 0.5–20.0)

## 2021-11-13 LAB — URIC ACID: Uric Acid, Serum: 5.9 mg/dL (ref 2.4–7.0)

## 2021-11-14 ENCOUNTER — Encounter: Payer: Self-pay | Admitting: Family Medicine

## 2021-11-19 ENCOUNTER — Telehealth: Payer: Self-pay | Admitting: Family Medicine

## 2021-11-19 DIAGNOSIS — M25561 Pain in right knee: Secondary | ICD-10-CM

## 2021-11-19 DIAGNOSIS — M7041 Prepatellar bursitis, right knee: Secondary | ICD-10-CM

## 2021-11-19 NOTE — Telephone Encounter (Signed)
Pt seen 11/12/21, was treated, had some improvement immediately, but still experiencing symptoms, swelling, redness and hot to touch, painful, no full range of motion. Open to any suggestions for further treatment

## 2021-11-20 NOTE — Telephone Encounter (Signed)
Spoke with pcp, refer to sports medicine.

## 2021-11-20 NOTE — Telephone Encounter (Signed)
I called and spoke with patient. She is aware referral has been placed to sports medicine & they will call her to schedule.

## 2021-11-22 ENCOUNTER — Other Ambulatory Visit: Payer: Self-pay | Admitting: Obstetrics & Gynecology

## 2021-11-22 DIAGNOSIS — Z1231 Encounter for screening mammogram for malignant neoplasm of breast: Secondary | ICD-10-CM

## 2021-11-27 NOTE — Progress Notes (Signed)
[place] Phone: [phone]   Assessment and Plan:     1. Acute pain of right knee 2. Prepatellar bursitis of right knee -Acute, improving, initial sports medicine visit - Consistent with prepatellar bursitis of right knee based on HPI, physical exam, unremarkable x-ray imaging though patient does not have a known MOI - Reviewed x-ray with patient in room.  My interpretation: No acute fracture or dislocation.  Mild patella [place]. - Start Voltaren gel topically as needed - Tylenol/NSAIDs as needed - Recommend avoiding direct contact with patella including limiting kneeling activities.  Can continue physical activity as tolerated   Pertinent previous records reviewed include knee x-ray 11/12/2021, family medicine note 11/12/2021   Follow Up: As needed if no improvement or worsening of symptoms   Subjective:   I, Pincus Badder, am serving as a Education administrator for Doctor Glennon Mac  Chief Complaint: right knee pain  HPI:  11/28/2021 Patient is a 51 year old female complaining of right knee pain . Patient states incident occurred about 14 days ago. There was no injury mechanism. The pain is present in the right knee. The quality of the pain is described as aching. The pain is moderate. The pain has been Improving since onset. Associated symptoms include a loss of motion. Pertinent negatives include no inability to bear weight, loss of sensation, muscle weakness, numbness or tingling. The symptoms are aggravated by movement. She has tried NSAIDs for the symptoms. The treatment provided mild relief. she is starting to feel better has been using prednisone 5 days , still a little red and a  little hot to the touch , hx of hamstring pull but rested and doesn't feel any pain just wanted it noted   Relevant Historical Information: None pertinent  Additional pertinent review of systems negative.   Current  Outpatient Medications:    ADVAIR DISKUS 100-50 MCG/DOSE AEPB, Inhale 1 puff into the lungs 2 (two) times daily. , Disp: , Rfl:    Calcium Carb-Cholecalciferol (CALCIUM 600+D3 PO), Take by mouth., Disp: , Rfl:    Cholecalciferol (VITAMIN D PO), Take 1 tablet by mouth daily., Disp: , Rfl:    clobetasol (OLUX) 0.05 % topical foam, APPLY TO AFFECTED AREAS OF BODY (NOT FACE) 1 2 TIMES DAILY AS NEEDED, Disp: , Rfl:    diphenhydrAMINE (BENADRYL) 25 MG tablet, Take 25 mg by mouth as needed., Disp: , Rfl:    fluticasone (FLONASE) 50 MCG/ACT nasal spray, Place into both nostrils daily., Disp: , Rfl:    ondansetron (ZOFRAN-ODT) 8 MG disintegrating tablet, TAKE 1 TABLET BY MOUTH EVERY 8 HOURS AS NEEDED FOR NAUSEA, VOMITING OR REFRACTORY NAUSEA / VOMITING., Disp: 9 tablet, Rfl: 0   SUMAtriptan (IMITREX) 50 MG tablet, Take 1 tablet (50 mg total) by mouth daily as needed for migraine. May repeat in 2 hours if headache persists or recurs., Disp: 10 tablet, Rfl: 1   thiamine 100 MG tablet, Take 1 tablet (100 mg total) by mouth daily., Disp: 90 tablet, Rfl: 3   VENTOLIN HFA 108 (90 BASE) MCG/ACT inhaler, Inhale 1-2 puffs into the lungs as needed. Reported on 05/29/2015, Disp: , Rfl:    Objective:     Vitals:   11/28/21 1010  BP: 110/80  Pulse: 87  SpO2: 96%  Weight: 128 lb (58.1 kg)  Height: '5\' 2"'$  (1.575 m)      Body mass index is 23.41 kg/m.    Physical Exam:  General:  awake, alert oriented, no acute distress nontoxic Skin: no suspicious lesions or rashes Neuro:sensation intact, no deficits, strength 5/5 with no deficits, no atrophy, normal muscle tone Psych: No signs of anxiety, depression or other mood disorder  Right knee: Mild prepatellar erythema and swelling with mild associated TTP No deformity Neg fluid wave, joint milking ROM Flex 110 , Ext 0  NTTP over the quad tendon, medial fem condyle, lat fem condyle,  , plica, patella tendon, tibial tuberostiy, fibular head, posterior fossa,  pes anserine bursa, gerdy's tubercle, medial jt line, lateral jt line Neg anterior and posterior drawer Neg lachman Neg sag sign Negative varus stress Negative valgus stress Negative McMurray Negative [place]ssaly  Gait normal    Electronically signed by:  Shelly EdwardsMarguerita Merles [facility] 10:50 AM 11/28/21

## 2021-11-28 ENCOUNTER — Ambulatory Visit (INDEPENDENT_AMBULATORY_CARE_PROVIDER_SITE_OTHER): Payer: 59 | Admitting: Sports Medicine

## 2021-11-28 ENCOUNTER — Ambulatory Visit
Admission: RE | Admit: 2021-11-28 | Discharge: 2021-11-28 | Disposition: A | Discharge status: Home / Self Care | Payer: 59 | Source: Ambulatory Visit | Attending: Obstetrics & Gynecology | Admitting: Obstetrics & Gynecology

## 2021-11-28 VITALS — BP 110/80 | HR 87 | Ht 62.0 in | Wt 128.0 lb

## 2021-11-28 DIAGNOSIS — M7041 Prepatellar bursitis, right knee: Secondary | ICD-10-CM

## 2021-11-28 DIAGNOSIS — Z1231 Encounter for screening mammogram for malignant neoplasm of breast: Secondary | ICD-10-CM

## 2021-11-28 DIAGNOSIS — M25561 Pain in right knee: Secondary | ICD-10-CM | POA: Diagnosis not present

## 2021-11-28 NOTE — Patient Instructions (Addendum)
Good to see you Voltaren topically over pain Gentle knee exercises given  Follow up as needed

## 2021-12-02 ENCOUNTER — Other Ambulatory Visit: Payer: Self-pay | Admitting: Obstetrics & Gynecology

## 2021-12-02 DIAGNOSIS — R928 Other abnormal and inconclusive findings on diagnostic imaging of breast: Secondary | ICD-10-CM

## 2021-12-11 ENCOUNTER — Other Ambulatory Visit: Payer: Self-pay | Admitting: Obstetrics & Gynecology

## 2021-12-11 ENCOUNTER — Ambulatory Visit
Admission: RE | Admit: 2021-12-11 | Discharge: 2021-12-11 | Disposition: A | Discharge status: Home / Self Care | Payer: 59 | Source: Ambulatory Visit | Attending: Obstetrics & Gynecology | Admitting: Obstetrics & Gynecology

## 2021-12-11 DIAGNOSIS — R928 Other abnormal and inconclusive findings on diagnostic imaging of breast: Secondary | ICD-10-CM

## 2021-12-11 DIAGNOSIS — N6320 Unspecified lump in the left breast, unspecified quadrant: Secondary | ICD-10-CM

## 2021-12-12 ENCOUNTER — Ambulatory Visit
Admission: RE | Admit: 2021-12-12 | Discharge: 2021-12-12 | Disposition: A | Discharge status: Home / Self Care | Payer: 59 | Source: Ambulatory Visit | Attending: Obstetrics & Gynecology | Admitting: Obstetrics & Gynecology

## 2021-12-12 DIAGNOSIS — N6320 Unspecified lump in the left breast, unspecified quadrant: Secondary | ICD-10-CM

## 2021-12-19 ENCOUNTER — Encounter: Payer: Self-pay | Admitting: Physician Assistant

## 2021-12-19 ENCOUNTER — Ambulatory Visit (INDEPENDENT_AMBULATORY_CARE_PROVIDER_SITE_OTHER): Payer: 59 | Admitting: Physician Assistant

## 2021-12-19 VITALS — BP 120/86 | HR 76 | Ht 61.25 in | Wt 125.2 lb

## 2021-12-19 DIAGNOSIS — R110 Nausea: Secondary | ICD-10-CM | POA: Diagnosis not present

## 2021-12-19 MED ORDER — ONDANSETRON 8 MG PO TBDP: ORAL_TABLET | ORAL | 11 refills | Status: DC

## 2021-12-19 NOTE — Patient Instructions (Addendum)
If you are age 52 or younger, your body mass index should be between 19-25. Your Body mass index is 23.47 kg/m. If this is out of the aformentioned range listed, please consider follow up with your Primary Care Provider.  ________________________________________________________  The [facility] providers would like to encourage you to use [facility] to communicate with providers for non-urgent requests or questions.  Due to long hold times on the telephone, sending your provider a message by [facility] may be a faster and more efficient way to get a response.  Please allow 48 business hours for a response.  Please remember that this is for non-urgent requests.  _______________________________________________________  We have sent the following medications to your pharmacy for you to pick up at your convenience:  Ondansetron '8mg'$    You will follow up in our office in 1 year (July 24).  We will contact you to get this scheduled.  Thank you for entrusting me with your care and choosing [facility].  Ellouise Newer, PA-C

## 2021-12-19 NOTE — Progress Notes (Signed)
Chief Complaint: Follow-up nausea  HPI:    Shelly Edwards is a 51 year old female with a past medical history as listed below, known to Dr. Carlean Purl, who returns to clinic today for follow-up of nausea.    04/21/2019 telemedicine visit for episodic nausea vomiting and diarrhea.  At that time stopped her PPI as it was not helping and schedule her for EGD and colonoscopy.  Refilled Ondansetron 8 mg per    06/08/2019 EGD and colonoscopy.  Colonoscopy with 1 mm polyp in the cecum and otherwise normal.  Pathology showed adenomatous polyp and repeat recommended in 7 years.  EGD with gastritis and otherwise normal.  Patient started on Alosetron 0.5 mg.    10/23/2021 patient called for refill of ondansetron.    11/12/2021 CBC, hepatic function panel, BMP and CRP normal.    Today, patient tells me that her symptoms of nausea have actually gotten better over the past few years.  Previously was 1-2 times a week that she would have to take Zofran, but now it just hits her once every couple of weeks.  Explains it as a wave of nausea and when it occurs she will take half of an 8 mg tablet at first and if that works then she will let it be, if she needs more she will take the other half.  Tells me this is much better than it was previously but having the Zofran on hand is crucial for her.  Denies any new symptoms.    Patient just went to see the [employer] concert last night and Rob Marcello Moores has cooked her dinner before.    Denies fever, chills, weight loss, change in bowel habits, abdominal pain, reflux or symptoms that awaken her from sleep.  Past Medical History:  Diagnosis Date   Allergic rhinitis    Allergy    Asthma    Hx of adenomatous polyp of colon 06/14/2019    Past Surgical History:  Procedure Laterality Date   AUGMENTATION MAMMAPLASTY     COLONOSCOPY  7948   UMBILICAL HERNIA REPAIR  2008   VAGINAL WOUND CLOSURE / REPAIR  4/10   RIGHT    Current Outpatient Medications  Medication Sig Dispense  Refill   ADVAIR DISKUS 100-50 MCG/DOSE AEPB Inhale 1 puff into the lungs 2 (two) times daily.      AMBULATORY NON FORMULARY MEDICATION Triple Rosacea Cream Azelaic acid15%/Metronidazole 1%/Ivermectin 1% 1 application as needed     Calcium Carb-Cholecalciferol (CALCIUM 600+D3 PO) Take by mouth.     Cholecalciferol (VITAMIN D PO) Take 1 tablet by mouth daily.     clobetasol (OLUX) 0.05 % topical foam APPLY TO AFFECTED AREAS OF BODY (NOT FACE) 1 2 TIMES DAILY AS NEEDED     diphenhydrAMINE (BENADRYL) 25 MG tablet Take 25 mg by mouth as needed.     fluticasone (FLONASE) 50 MCG/ACT nasal spray Place into both nostrils daily.     ondansetron (ZOFRAN-ODT) 8 MG disintegrating tablet TAKE 1 TABLET BY MOUTH EVERY 8 HOURS AS NEEDED FOR NAUSEA, VOMITING OR REFRACTORY NAUSEA / VOMITING. 9 tablet 0   SUMAtriptan (IMITREX) 50 MG tablet Take 1 tablet (50 mg total) by mouth daily as needed for migraine. May repeat in 2 hours if headache persists or recurs. 10 tablet 1   Tapinarof (VTAMA) 1 % CREA Apply 1 application  topically as needed.     thiamine 100 MG tablet Take 1 tablet (100 mg total) by mouth daily. 90 tablet 3   VENTOLIN HFA  108 (90 BASE) MCG/ACT inhaler Inhale 1-2 puffs into the lungs as needed. Reported on 05/29/2015     No current facility-administered medications for this visit.    Allergies as of 12/19/2021 - Review Complete 12/19/2021  Allergen Reaction Noted   Molds & smuts Itching and Shortness Of Breath 10/27/2017    Family History  Problem Relation Age of Onset   Hypertension Mother    Colon polyps Mother        pre-cancerous polyps   Hypertension Father    Heart disease Father    Prostate cancer Father    Colon polyps Father        pre-cancerous polyps   Breast cancer Maternal Grandmother    Colon cancer Paternal Grandmother 69   Kidney disease Neg Hx    Gallbladder disease Neg Hx    Esophageal cancer Neg Hx    Stomach cancer Neg Hx    Rectal cancer Neg Hx     Social  History   Socioeconomic History   Marital status: Single    Spouse name: Not on file   Number of children: 2   Years of education: Not on file   Highest education level: Not on file  Occupational History   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[employer]  Tobacco Use   Smoking status: Former    Types: Cigarettes    Quit date: 05/28/1987    Years since quitting: 34.5   Smokeless tobacco: Never  Vaping Use   Vaping Use: Never used  Substance and Sexual Activity   Alcohol use: Yes    Alcohol/week: 0.0 standard drinks of alcohol    Comment: occ   Drug use: No   Sexual activity: Yes    Comment: patients husband with vasectomy  Other Topics Concern   Not on file  Social History Narrative   Divorced, 2 sons   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   Social Determinants of Health   Financial Resource Strain: Not on file  Food Insecurity: Not on file  Transportation Needs: Not on file  Physical Activity: Not on file  Stress: Not on file  Social Connections: Not on file  Intimate Partner Violence: Not on file    Review of Systems:    Constitutional: No weight loss, fever or chills Skin: No rash  Cardiovascular: No chest pain Respiratory: No SOB  Gastrointestinal: See HPI and otherwise negative Genitourinary: No dysuria  Neurological: No headache, dizziness or syncope Musculoskeletal: No new muscle or joint pain Hematologic: No bleeding  Psychiatric: No history of depression or anxiety   Physical Exam:  Vital signs: BP 120/86 (BP Location: Left Arm, Patient Position: Sitting, Cuff Size: Normal)   Pulse 76   Ht 5' 1.25" (1.556 m) Comment: height measured without shoes  Wt 125 lb 4 oz (56.8 kg)   LMP 11/14/2021   BMI 23.47 kg/m   Constitutional:   Pleasant Caucasian female appears to be in NAD, Well developed, Well nourished, alert and cooperative Head:  Normocephalic and atraumatic. Eyes:   PEERL, EOMI. No icterus. Conjunctiva  pink. Ears:  Normal auditory acuity. Neck:  Supple Throat: Oral cavity and pharynx without inflammation, swelling or lesion.  Respiratory: Respirations even and unlabored. Lungs clear to auscultation bilaterally.   No wheezes, crackles, or rhonchi.  Cardiovascular: Normal S1, S2. No MRG. Regular rate and rhythm. No peripheral edema, cyanosis or pallor.  Gastrointestinal:  Soft, nondistended, nontender. No rebound or guarding. Normal bowel sounds. No  appreciable masses or hepatomegaly. Rectal:  Not performed.  Msk:  Symmetrical without gross deformities. Without edema, no deformity or joint abnormality.  Neurologic:  Alert and  oriented x4;  grossly normal neurologically.  Skin:   Dry and intact without significant lesions or rashes. Psychiatric: Demonstrates good judgement and reason without abnormal affect or behaviors.  RELEVANT LABS AND IMAGING: CBC    Component Value Date/Time   WBC 10.0 11/12/2021 1538   RBC 4.10 11/12/2021 1538   HGB 13.0 11/12/2021 1538   HCT 39.2 11/12/2021 1538   PLT 244.0 11/12/2021 1538   MCV 95.5 11/12/2021 1538   MCH 32.7 05/27/2018 0949   MCHC 33.1 11/12/2021 1538   RDW 13.7 11/12/2021 1538   LYMPHSABS 1.5 11/12/2021 1538   MONOABS 1.0 11/12/2021 1538   EOSABS 0.1 11/12/2021 1538   BASOSABS 0.1 11/12/2021 1538    CMP     Component Value Date/Time   NA 137 11/12/2021 1538   K 4.3 11/12/2021 1538   CL 103 11/12/2021 1538   CO2 24 11/12/2021 1538   GLUCOSE 70 11/12/2021 1538   BUN 7 11/12/2021 1538   CREATININE 0.63 11/12/2021 1538   CREATININE 0.95 05/27/2018 0949   CALCIUM 9.2 11/12/2021 1538   PROT 7.1 11/12/2021 1538   ALBUMIN 3.8 11/12/2021 1538   AST 18 11/12/2021 1538   ALT 11 11/12/2021 1538   ALKPHOS 93 11/12/2021 1538   BILITOT 0.4 11/12/2021 1538    Assessment: 1.  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   Refilled Ondansetron 8 mg every 6 hours as needed.  Prescribed #27 with 11 refills 2.  Patient to follow in clinic in a year or sooner if necessary.  Ellouise Newer, PA-C [facility] 12/19/2021, 10:03 AM  Cc: [place], Betty G, MD

## 2022-06-27 ENCOUNTER — Ambulatory Visit: Payer: 59 | Admitting: Obstetrics & Gynecology

## 2022-07-23 ENCOUNTER — Telehealth (INDEPENDENT_AMBULATORY_CARE_PROVIDER_SITE_OTHER): Payer: 59 | Admitting: Family Medicine

## 2022-07-23 ENCOUNTER — Encounter: Payer: Self-pay | Admitting: Family Medicine

## 2022-07-23 VITALS — Ht 61.25 in

## 2022-07-23 DIAGNOSIS — J4530 Mild persistent asthma, uncomplicated: Secondary | ICD-10-CM

## 2022-07-23 DIAGNOSIS — N938 Other specified abnormal uterine and vaginal bleeding: Secondary | ICD-10-CM

## 2022-07-23 DIAGNOSIS — N644 Mastodynia: Secondary | ICD-10-CM

## 2022-07-23 DIAGNOSIS — U071 COVID-19: Secondary | ICD-10-CM | POA: Diagnosis not present

## 2022-07-23 MED ORDER — VENTOLIN HFA 108 (90 BASE) MCG/ACT IN AERS: 1.0000 | INHALATION_SPRAY | RESPIRATORY_TRACT | 1 refills | Status: AC | PRN

## 2022-07-23 MED ORDER — NIRMATRELVIR/RITONAVIR (PAXLOVID)TABLET: 3.0000 | ORAL_TABLET | Freq: Two times a day (BID) | ORAL | 0 refills | Status: AC

## 2022-07-23 NOTE — Progress Notes (Addendum)
Virtual Visit via Video Note I connected with Shelly Edwards on 07/23/22 by a video enabled telemedicine application and verified that I am speaking with the correct person using two identifiers. Location patient: home Location provider:work office Persons participating in the virtual visit: patient, provider  I discussed the limitations of evaluation and management by telemedicine and the availability of in person appointments. The patient expressed understanding and agreed to proceed.  Chief Complaint  Patient presents with   Covid Positive   HPI: Shelly Edwards is a 52 year old female with history of allergy rhinitis, asthma,alcohol dependency, and vit D deficiency complaining of 2 to 3 days of respiratory symptoms. She is experiencing rhinorrhea, sore throat, body aches, and fatigue, with a slight cough. No changes in smell or taste.  Negative for CP, SOB,wheezing, abdominal pain,changes in bowel habits, urinary symptoms, or skin rash.  She took Advil in the middle of the night with empty stomach and went back to bed, had episode of nausea and vomiting x 1, which she attributes to not allowing the medication to settle in her stomach before lying down.  She has been taking DayQuil for symptom relief. A friend had a positive COVID 19 test a couple days ago.  Asthma following with immunologist, she is currently on Advair 100-50 mcg 1 bid.  For the the past month or so, she has had intermittent vaginal bleeding,spotting, for 18 of the last 31 days.  She has had to reschedule her appoint with gynecologist. She is also complaining of  left breast pain, inner aspect,throbbing intermittent for several months.  Mammogram 12/11/21 Bi-rads 4. Left breast Bx 12/12/21: Breast, left, needle core biopsy, 10 o'clock, 4 cmfn, mass - SCLEROTIC/HYALINIZED FIBROADENOMATOID NODULE - NO MALIGNANCY IDENTIFIED, pain is not around this area.  She has not noted erythema,edema,masses,or nipple  discharge.  ROS: See pertinent positives and negatives per HPI.  Past Medical History:  Diagnosis Date   Allergic rhinitis    Allergy    Asthma    Hx of adenomatous polyp of colon 06/14/2019   Past Surgical History:  Procedure Laterality Date   AUGMENTATION MAMMAPLASTY     COLONOSCOPY  [id number]   UMBILICAL HERNIA REPAIR  2008   VAGINAL WOUND CLOSURE / REPAIR  4/10   RIGHT   Family History  Problem Relation Age of Onset   Hypertension Mother    Colon polyps Mother        pre-cancerous polyps   Hypertension Father    Heart disease Father    Prostate cancer Father    Colon polyps Father        pre-cancerous polyps   Breast cancer Maternal Grandmother    Colon cancer Paternal Grandmother 22   Kidney disease Neg Hx    Gallbladder disease Neg Hx    Esophageal cancer Neg Hx    Stomach cancer Neg Hx    Rectal cancer Neg Hx    Social History   Socioeconomic History   Marital status: Single    Spouse name: Not on file   Number of children: 2   Years of education: Not on file   Highest education level: Not on file  Occupational History   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[employer]  Tobacco Use   Smoking status: Former    Types: Cigarettes    Quit date: 05/28/1987    Years since quitting: 35.1   Smokeless tobacco: Never  Vaping Use   Vaping Use: Never used  Substance and  Sexual Activity   Alcohol use: Yes    Alcohol/week: 0.0 standard drinks of alcohol    Comment: occ   Drug use: No   Sexual activity: Yes    Comment: patients husband with vasectomy  Other Topics Concern   Not on file  Social History Narrative   Divorced, 2 sons   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 , Disp: , Rfl:    AMBULATORY NON FORMULARY MEDICATION, Triple Rosacea Cream Azelaic acid15%/Metronidazole 1%/Ivermectin 1% 1 application as needed, Disp: , Rfl:    Calcium Carb-Cholecalciferol (CALCIUM 600+D3 PO), Take by mouth., Disp: , Rfl:    Cholecalciferol (VITAMIN D PO), Take 1 tablet by mouth daily., Disp: , Rfl:    clobetasol (OLUX) 0.05 % topical foam, APPLY TO AFFECTED AREAS OF BODY (NOT FACE) 1 2 TIMES DAILY AS NEEDED, Disp: , Rfl:    diphenhydrAMINE (BENADRYL) 25 MG tablet, Take 25 mg by mouth as needed., Disp: , Rfl:    fluticasone (FLONASE) 50 MCG/ACT nasal spray, Place into both nostrils daily., Disp: , Rfl:    nirmatrelvir/ritonavir (PAXLOVID) 20 x 150 MG & 10 x 100MG TABS, Take 3 tablets by mouth 2 (two) times daily for 5 days. (Take nirmatrelvir 150 mg two tablets twice daily for 5 days and ritonavir 100 mg one tablet twice daily for 5 days) Patient GFR is 102, Disp: 30 tablet, Rfl: 0   ondansetron (ZOFRAN-ODT) 8 MG disintegrating tablet, TAKE 1 TABLET BY MOUTH EVERY 8 HOURS AS NEEDED FOR NAUSEA, VOMITING OR REFRACTORY NAUSEA / VOMITING., Disp: 27 tablet, Rfl: 11   SUMAtriptan (IMITREX) 50 MG tablet, Take 1 tablet (50 mg total) by mouth daily as needed for migraine. May repeat in 2 hours if headache persists or recurs., Disp: 10 tablet, Rfl: 1   thiamine 100 MG tablet, Take 1 tablet (100 mg total) by mouth daily., Disp: 90 tablet, Rfl: 3   VENTOLIN HFA 108 (90 Base) MCG/ACT inhaler, Inhale 1-2 puffs into the lungs as needed. Reported on 05/29/2015, Disp: 18 g, Rfl: 1  EXAM:  VITALS per patient if applicable:Ht 5' [id number]" (1.556 m)   BMI 23.47 kg/m   GENERAL: alert, oriented, appears well and in no acute distress  HEENT: atraumatic, conjunctiva clear, no obvious abnormalities on inspection of external nose and ears  NECK: normal movements of the head and  neck  LUNGS: on inspection no signs of respiratory distress, breathing rate appears normal, no obvious gross SOB, gasping or wheezing  CV: no obvious cyanosis  MS: moves all visible extremities without noticeable abnormality  PSYCH/NEURO: pleasant and cooperative, no obvious depression or anxiety, speech and thought processing grossly intact  ASSESSMENT AND PLAN:  Discussed the following assessment and plan:  COVID-19 virus infection We discussed Dx,possible complications and treatment options. She has a mild case with risk for complications. We discussed oral antiviral options and side effects. She agrees with trying Paxlovid. Symptomatic treatment with plenty of fluids,rest,tylenol 500 mg 3-4 times per day prn. Throat lozenges if needed for sore throat. 5-7 days of quarantine. Explained that  cough and congestion may last a few more days and even weeks after acute symptoms have resolved. Clearly instructed about warning signs.  -     nirmatrelvir/ritonavir; Take 3 tablets by mouth 2 (two) times daily for 5 days. (Take nirmatrelvir 150 mg two tablets twice daily for 5 days and ritonavir 100 mg one tablet twice daily for 5 days) Patient GFR is 102  Dispense: 30 tablet; Refill: 0  Breast pain in female New onset of left breast pain and hx of abnormal mammogram, will arrange Dx mammogram.  -     MM DIAG BREAST TOMO BILATERAL; Future  Mild persistent asthma, uncomplicated Currently asymptomatic. Running low on Albuterol inh, sent a Rx. Instructed about warning signs. Follows with immunologist.  -     Ventolin HFA; Inhale 1-2 puffs into the lungs as needed. Reported on 05/29/2015  Dispense: 18 g; Refill: 1  DUB (dysfunctional uterine bleeding)  Planning on re-scheduling appt with her gyn.  We discussed possible serious and likely etiologies, options for evaluation and workup, limitations of telemedicine visit vs in person visit, treatment, treatment risks and precautions. The  patient was advised to call back or seek an in-person evaluation if the symptoms worsen or if the condition fails to improve as anticipated. I discussed the assessment and treatment plan with the patient. The patient was provided an opportunity to ask questions and all were answered. The patient agreed with the plan and demonstrated an understanding of the instructions.  Return if symptoms worsen or fail to improve, for keep next appointment.  Joneisha Miles G. Martin[place], MD  [facility]. [place] office.

## 2022-07-25 ENCOUNTER — Ambulatory Visit: Payer: 59 | Admitting: Obstetrics & Gynecology

## 2022-08-01 ENCOUNTER — Encounter: Payer: Self-pay | Admitting: Obstetrics & Gynecology

## 2022-08-01 DIAGNOSIS — N924 Excessive bleeding in the premenopausal period: Secondary | ICD-10-CM

## 2022-08-04 NOTE — Telephone Encounter (Signed)
Per ML: "Please schedule a Pelvic US outside our office and a visit with me after to review the result, do a gyn exam and labs."

## 2022-08-07 ENCOUNTER — Other Ambulatory Visit: Payer: Self-pay | Admitting: Family Medicine

## 2022-08-07 DIAGNOSIS — N644 Mastodynia: Secondary | ICD-10-CM

## 2022-08-07 NOTE — Telephone Encounter (Signed)
FYI. Pt scheduled for [place] @ [facility] on 09/04/2022 and f/u OV is scheduled on 09/15/2022. Will route to provider for final review and close encounter.

## 2022-08-21 ENCOUNTER — Ambulatory Visit
Admission: RE | Admit: 2022-08-21 | Discharge: 2022-08-21 | Disposition: A | Discharge status: Home / Self Care | Payer: 59 | Source: Ambulatory Visit | Attending: Family Medicine | Admitting: Family Medicine

## 2022-08-21 ENCOUNTER — Ambulatory Visit: Payer: 59

## 2022-08-21 DIAGNOSIS — N644 Mastodynia: Secondary | ICD-10-CM

## 2022-09-04 ENCOUNTER — Encounter: Payer: Self-pay | Admitting: Obstetrics & Gynecology

## 2022-09-04 ENCOUNTER — Ambulatory Visit
Admission: RE | Admit: 2022-09-04 | Discharge: 2022-09-04 | Disposition: A | Discharge status: Home / Self Care | Payer: 59 | Source: Ambulatory Visit | Attending: Obstetrics & Gynecology | Admitting: Obstetrics & Gynecology

## 2022-09-04 DIAGNOSIS — N924 Excessive bleeding in the premenopausal period: Secondary | ICD-10-CM

## 2022-09-15 ENCOUNTER — Ambulatory Visit: Payer: 59 | Admitting: Obstetrics & Gynecology

## 2022-10-01 ENCOUNTER — Ambulatory Visit: Payer: Self-pay | Admitting: Obstetrics & Gynecology

## 2023-01-06 ENCOUNTER — Other Ambulatory Visit: Payer: Self-pay | Admitting: Physician Assistant

## 2023-02-05 ENCOUNTER — Encounter: Payer: Self-pay | Admitting: Physician Assistant

## 2023-02-05 ENCOUNTER — Ambulatory Visit (INDEPENDENT_AMBULATORY_CARE_PROVIDER_SITE_OTHER): Payer: 59 | Admitting: Physician Assistant

## 2023-02-05 VITALS — BP 122/78 | HR 80 | Ht 61.25 in | Wt 118.0 lb

## 2023-02-05 DIAGNOSIS — R110 Nausea: Secondary | ICD-10-CM

## 2023-02-05 MED ORDER — ONDANSETRON 8 MG PO TBDP: 8.0000 mg | ORAL_TABLET | Freq: Three times a day (TID) | ORAL | 11 refills | Status: DC | PRN

## 2023-02-05 NOTE — Progress Notes (Signed)
Chief Complaint: Follow-up nausea  HPI:    Shelly Edwards is a 52 year old female with a past medical history as listed below, known to Dr. Leone Payor, who returns to clinic today for follow-up of nausea.    04/21/2019 telemedicine visit for episodic nausea vomiting and diarrhea.  At that time stopped her PPI as it was not helping and schedule her for EGD and colonoscopy.  Refilled Ondansetron 8 mg per    06/08/2019 EGD and colonoscopy.  Colonoscopy with 1 mm polyp in the cecum and otherwise normal.  Pathology showed adenomatous polyp and repeat recommended in 7 years.  EGD with gastritis and otherwise normal.  Patient started on Alosetron 0.5 mg.    10/23/2021 patient called for refill of ondansetron.    11/12/2021 CBC, hepatic function panel, BMP and CRP normal.    12/19/2021 patient seen in clinic and at that time discussed that her nausea had actually gotten better over the past few years, previously been on Zofran 1-2 times a week but at that point was just once every couple of weeks.  Discussed taking half of an 8 mg tablet first to see if it worked and then the other half which it did not.  At that point refilled Ondansetron 8 mg every 6 hours as needed.  #27 with 11 refills.    Today, the patient presents to clinic and tells me that she continues with some nausea that comes and goes but sometimes it can be gone for a few months and then it can come back and be maybe 2 or 3 days a week.  She continues her Ondansetron using a half of an 8 mg tab and then another half if needed.  This continues to work well for her.  Some question about whether this is connected to her period.    Denies fever, chills, weight loss, vomiting, heartburn, reflux or abdominal pain.  Past Medical History:  Diagnosis Date   Allergic rhinitis    Allergy    Asthma    Hx of adenomatous polyp of colon 06/14/2019    Past Surgical History:  Procedure Laterality Date   AUGMENTATION MAMMAPLASTY     COLONOSCOPY  2016    UMBILICAL HERNIA REPAIR  2008   VAGINAL WOUND CLOSURE / REPAIR  4/10   RIGHT    Current Outpatient Medications  Medication Sig Dispense Refill   ADVAIR DISKUS 100-50 MCG/DOSE AEPB Inhale 1 puff into the lungs 2 (two) times daily.      AMBULATORY NON FORMULARY MEDICATION Triple Rosacea Cream Azelaic acid15%/Metronidazole 1%/Ivermectin 1% 1 application as needed     Calcium Carb-Cholecalciferol (CALCIUM 600+D3 PO) Take by mouth.     Cholecalciferol (VITAMIN D PO) Take 1 tablet by mouth daily.     clobetasol (OLUX) 0.05 % topical foam APPLY TO AFFECTED AREAS OF BODY (NOT FACE) 1 2 TIMES DAILY AS NEEDED     diphenhydrAMINE (BENADRYL) 25 MG tablet Take 25 mg by mouth as needed.     fluticasone (FLONASE) 50 MCG/ACT nasal spray Place into both nostrils daily.     ondansetron (ZOFRAN-ODT) 8 MG disintegrating tablet Take 1 tablet (8 mg total) by mouth every 8 (eight) hours as needed for nausea or vomiting. TAKE 1 TABLET BY MOUTH EVERY 8 HOURS AS NEEDED FOR NAUSEA, VOMITING OR REFRACTORY NAUSEA / VOMITING. 30 tablet 1   silver sulfADIAZINE (SILVADENE) 1 % cream      SUMAtriptan (IMITREX) 50 MG tablet Take 1 tablet (50 mg total) by mouth daily  as needed for migraine. May repeat in 2 hours if headache persists or recurs. 10 tablet 1   tacrolimus (PROTOPIC) 0.1 % ointment      thiamine 100 MG tablet Take 1 tablet (100 mg total) by mouth daily. 90 tablet 3   triamcinolone cream (KENALOG) 0.1 %      VENTOLIN HFA 108 (90 Base) MCG/ACT inhaler Inhale 1-2 puffs into the lungs as needed. Reported on 05/29/2015 18 g 1   No current facility-administered medications for this visit.    Allergies as of 02/05/2023 - Review Complete 07/23/2022  Allergen Reaction Noted   Molds & smuts Itching and Shortness Of Breath 10/27/2017    Family History  Problem Relation Age of Onset   Hypertension Mother    Colon polyps Mother        pre-cancerous polyps   Hypertension Father    Heart disease Father    Prostate  cancer Father    Colon polyps Father        pre-cancerous polyps   Breast cancer Maternal Grandmother    Colon cancer Paternal Grandmother 23   Kidney disease Neg Hx    Gallbladder disease Neg Hx    Esophageal cancer Neg Hx    Stomach cancer Neg Hx    Rectal cancer Neg Hx     Social History   Socioeconomic History   Marital status: Single    Spouse name: Not on file   Number of children: 2   Years of education: Not on file   Highest education level: Not on file  Occupational History   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[employer]  Tobacco Use   Smoking status: Former    Current packs/day: 0.00    Types: Cigarettes    Quit date: 05/28/1987    Years since quitting: 35.7   Smokeless tobacco: Never  Vaping Use   Vaping status: Never Used  Substance and Sexual Activity   Alcohol use: Yes    Alcohol/week: 0.0 standard drinks of alcohol    Comment: occ   Drug use: No   Sexual activity: Yes    Birth control/protection: Other-see comments    Comment: VAS  Other Topics Concern   Not on file  Social History Narrative   Divorced, 2 sons   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   Social Determinants of Health   Financial Resource Strain: Not on file  Food Insecurity: Not on file  Transportation Needs: Not on file  Physical Activity: Not on file  Stress: Not on file  Social Connections: Not on file  Intimate Partner Violence: Not on file    Review of Systems:    Constitutional: No weight loss, fever, chills, weakness or fatigue Cardiovascular: No chest pain Respiratory: No SOB  Gastrointestinal: See HPI and otherwise negative   Physical Exam:  Vital signs: BP 122/78 (BP Location: Left Arm, Patient Position: Sitting, Cuff Size: Normal)   Pulse 80   Ht 5' 1.25" (1.556 m)   Wt 118 lb (53.5 kg)   LMP 01/21/2023   BMI 22.11 kg/m    Constitutional:   Pleasant Caucasian female appears to be in NAD, Well developed, Well nourished, alert  and cooperative Respiratory: Respirations even and unlabored. Lungs clear to auscultation bilaterally.   No wheezes, crackles, or rhonchi.  Cardiovascular: Normal S1, S2. No MRG. Regular rate and rhythm. No peripheral edema, cyanosis or pallor.  Gastrointestinal:  Soft, nondistended, nontender. No rebound or guarding. Normal bowel  sounds. No appreciable masses or hepatomegaly. Rectal:  Not performed.  Psychiatric:  Demonstrates good judgement and reason without abnormal affect or behaviors.  RELEVANT LABS AND IMAGING: CBC    Component Value Date/Time   WBC 10.0 11/12/2021 1538   RBC 4.10 11/12/2021 1538   HGB 13.0 11/12/2021 1538   HCT 39.2 11/12/2021 1538   PLT 244.0 11/12/2021 1538   MCV 95.5 11/12/2021 1538   MCH 32.7 05/27/2018 0949   MCHC 33.1 11/12/2021 1538   RDW 13.7 11/12/2021 1538   LYMPHSABS 1.5 11/12/2021 1538   MONOABS 1.0 11/12/2021 1538   EOSABS 0.1 11/12/2021 1538   BASOSABS 0.1 11/12/2021 1538    CMP     Component Value Date/Time   NA 137 11/12/2021 1538   K 4.3 11/12/2021 1538   CL 103 11/12/2021 1538   CO2 24 11/12/2021 1538   GLUCOSE 70 11/12/2021 1538   BUN 7 11/12/2021 1538   CREATININE 0.63 11/12/2021 1538   CREATININE 0.95 05/27/2018 0949   CALCIUM 9.2 11/12/2021 1538   PROT 7.1 11/12/2021 1538   ALBUMIN 3.8 11/12/2021 1538   AST 18 11/12/2021 1538   ALT 11 11/12/2021 1538   ALKPHOS 93 11/12/2021 1538   BILITOT 0.4 11/12/2021 1538    Assessment: 1.  Chronic nausea: Once or twice every couple of weeks, resolves with Ondansetron, previous workup including EGD and colonoscopy were normal; thought likely functional dyspepsia versus relation to menstrual cycle  Plan: 1.  Refilled Ondansetron 8 mg every 6 hours as needed.  #30 with 11 refills 2.  Patient to follow in clinic in 2 years for further refills or sooner if needed.  Hyacinth Meeker, PA-C [facility] 02/05/2023, 10:04 AM  Cc: [place], Betty G, MD

## 2023-02-05 NOTE — Patient Instructions (Addendum)
We have sent the following medications to your pharmacy for you to pick up at your convenience: Zofran  Follow up in 1 year   If your blood pressure at your visit was 140/90 or greater, please contact your primary care physician to follow up on this.  _______________________________________________________  If you are age 53 or older, your body mass index should be between 23-30. Your There is no height or weight on file to calculate BMI. If this is out of the aforementioned range listed, please consider follow up with your Primary Care Provider.  If you are age 26 or younger, your body mass index should be between 19-25. Your There is no height or weight on file to calculate BMI. If this is out of the aformentioned range listed, please consider follow up with your Primary Care Provider.   ________________________________________________________  The [facility] providers would like to encourage you to use [facility] to communicate with providers for non-urgent requests or questions.  Due to long hold times on the telephone, sending your provider a message by [facility] may be a faster and more efficient way to get a response.  Please allow 48 business hours for a response.  Please remember that this is for non-urgent requests.  _______________________________________________________   Thank you for entrusting me with your care and choosing [facility].  Hyacinth Meeker PA-C

## 2023-03-12 ENCOUNTER — Telehealth: Payer: 59 | Admitting: Adult Health

## 2023-03-12 ENCOUNTER — Encounter: Payer: Self-pay | Admitting: Adult Health

## 2023-03-12 VITALS — Ht 61.25 in | Wt 117.0 lb

## 2023-03-12 DIAGNOSIS — J988 Other specified respiratory disorders: Secondary | ICD-10-CM | POA: Diagnosis not present

## 2023-03-12 MED ORDER — DOXYCYCLINE HYCLATE 100 MG PO CAPS: 100.0000 mg | ORAL_CAPSULE | Freq: Two times a day (BID) | ORAL | 0 refills | Status: DC

## 2023-03-12 MED ORDER — PREDNISONE 10 MG PO TABS: ORAL_TABLET | ORAL | 0 refills | Status: DC

## 2023-03-12 NOTE — Progress Notes (Signed)
Virtual Visit via Video Note  I connected with Shelly Edwards  on 03/12/23 at 11:30 AM EDT by a video enabled telemedicine application and verified that I am speaking with the correct person using two identifiers.  Location patient: home Location provider:work or home office Persons participating in the virtual visit: patient, provider  I discussed the limitations of evaluation and management by telemedicine and the availability of in person appointments. The patient expressed understanding and agreed to proceed.   HPI: 52 year old female who is being evaluated today for an acute issue.  She reports that 2 weeks she developed a sore throat and cough.  The sore throat has resolved but she continues to have a semiproductive cough with chest congestion.  She denies fevers or chills, does have some wheezing due to history of asthma but does not feel short of breath.  She does use Wixela inhaler and an albuterol rescue inhaler which seem to help with symptoms soon come back.  She has been using over-the-counter DayQuil and NyQuil as well   ROS: See pertinent positives and negatives per HPI.  Past Medical History:  Diagnosis Date   Allergic rhinitis    Allergy    Asthma    Hx of adenomatous polyp of colon 06/14/2019    Past Surgical History:  Procedure Laterality Date   AUGMENTATION MAMMAPLASTY     COLONOSCOPY  2016   UMBILICAL HERNIA REPAIR  2008   VAGINAL WOUND CLOSURE / REPAIR  4/10   RIGHT    Family History  Problem Relation Age of Onset   Hypertension Mother    Colon polyps Mother        pre-cancerous polyps   Hypertension Father    Heart disease Father    Prostate cancer Father    Colon polyps Father        pre-cancerous polyps   Breast cancer Maternal Grandmother    Colon cancer Paternal Grandmother 24   Kidney disease Neg Hx    Gallbladder disease Neg Hx    Esophageal cancer Neg Hx    Stomach cancer Neg Hx    Rectal cancer Neg Hx        Current Outpatient  Medications:    ADVAIR DISKUS 100-50 MCG/DOSE AEPB, Inhale 1 puff into the lungs 2 (two) times daily. , Disp: , Rfl:    AMBULATORY NON FORMULARY MEDICATION, Triple Rosacea Cream Azelaic acid15%/Metronidazole 1%/Ivermectin 1% 1 application as needed, Disp: , Rfl:    Calcium Carb-Cholecalciferol (CALCIUM 600+D3 PO), Take by mouth., Disp: , Rfl:    Cholecalciferol (VITAMIN D PO), Take 1 tablet by mouth daily., Disp: , Rfl:    clobetasol (OLUX) 0.05 % topical foam, APPLY TO AFFECTED AREAS OF BODY (NOT FACE) 1 2 TIMES DAILY AS NEEDED, Disp: , Rfl:    diphenhydrAMINE (BENADRYL) 25 MG tablet, Take 25 mg by mouth as needed., Disp: , Rfl:    fluticasone (FLONASE) 50 MCG/ACT nasal spray, Place into both nostrils daily., Disp: , Rfl:    ondansetron (ZOFRAN-ODT) 8 MG disintegrating tablet, Take 1 tablet (8 mg total) by mouth every 8 (eight) hours as needed for nausea or vomiting. TAKE 1 TABLET BY MOUTH EVERY 8 HOURS AS NEEDED FOR NAUSEA, VOMITING OR REFRACTORY NAUSEA / VOMITING., Disp: 30 tablet, Rfl: 11   silver sulfADIAZINE (SILVADENE) 1 % cream, , Disp: , Rfl:    SUMAtriptan (IMITREX) 50 MG tablet, Take 1 tablet (50 mg total) by mouth daily as needed for migraine. May repeat in 2 hours if headache  persists or recurs., Disp: 10 tablet, Rfl: 1   tacrolimus (PROTOPIC) 0.1 % ointment, , Disp: , Rfl:    thiamine 100 MG tablet, Take 1 tablet (100 mg total) by mouth daily., Disp: 90 tablet, Rfl: 3   triamcinolone cream (KENALOG) 0.1 %, , Disp: , Rfl:    VENTOLIN HFA 108 (90 Base) MCG/ACT inhaler, Inhale 1-2 puffs into the lungs as needed. Reported on 05/29/2015, Disp: 18 g, Rfl: 1  EXAM:  VITALS per patient if applicable:  GENERAL: alert, oriented, appears well and in no acute distress  HEENT: atraumatic, conjunttiva clear, no obvious abnormalities on inspection of external nose and ears  NECK: normal movements of the head and neck  LUNGS: on inspection no signs of respiratory distress, breathing rate  appears normal, no obvious gross SOB, gasping or wheezing  CV: no obvious cyanosis  MS: moves all visible extremities without noticeable abnormality  PSYCH/NEURO: pleasant and cooperative, no obvious depression or anxiety, speech and thought processing grossly intact  ASSESSMENT AND PLAN:  Discussed the following assessment and plan:  1. Respiratory infection -Will treat for bacterial infection due to duration and symptoms.  Will also cover for acute asthma exacerbation with prednisone.  Advise follow-up if no improvement in the next 3 to 4 days or sooner if symptoms worsen - doxycycline (VIBRAMYCIN) 100 MG capsule; Take 1 capsule (100 mg total) by mouth 2 (two) times daily.  Dispense: 14 capsule; Refill: 0 - predniSONE (DELTASONE) 10 MG tablet; 40 mg x 3 days, 20 mg x 3 days, 10 mg x 3 days  Dispense: 21 tablet; Refill: 0      I discussed the assessment and treatment plan with the patient. The patient was provided an opportunity to ask questions and all were answered. The patient agreed with the plan and demonstrated an understanding of the instructions.   The patient was advised to call back or seek an in-person evaluation if the symptoms worsen or if the condition fails to improve as anticipated.   Shirline Frees, NP

## 2023-04-10 ENCOUNTER — Ambulatory Visit: Payer: 59 | Admitting: Family Medicine

## 2023-04-10 ENCOUNTER — Ambulatory Visit: Payer: 59

## 2023-04-10 ENCOUNTER — Encounter: Payer: Self-pay | Admitting: Family Medicine

## 2023-04-10 VITALS — BP 118/70 | HR 101 | Temp 99.6°F | Ht 61.25 in | Wt 116.1 lb

## 2023-04-10 DIAGNOSIS — L03116 Cellulitis of left lower limb: Secondary | ICD-10-CM | POA: Diagnosis not present

## 2023-04-10 DIAGNOSIS — M25572 Pain in left ankle and joints of left foot: Secondary | ICD-10-CM | POA: Diagnosis not present

## 2023-04-10 MED ORDER — AMOXICILLIN-POT CLAVULANATE 500-125 MG PO TABS: 1.0000 | ORAL_TABLET | Freq: Two times a day (BID) | ORAL | 0 refills | Status: AC

## 2023-04-10 NOTE — Progress Notes (Signed)
Established Patient Office Visit   Subjective  Patient ID: Shelly Edwards, female    DOB: 1971/04/30  Age: 52 y.o. MRN: [id number]  Chief Complaint  Patient presents with   Ankle Pain    Patient came in today for left foot and ankle swelling, stared last night, rate of pain 4 out of 10, hurt to walk on     Pt is a 52 yo female seen for acute L ankle pain.  Pt noticed L Lateral ankle felt itching yesterday.  Patient then developed erythema and 4/10 pain with some discomfort upon standing.  Denies injury.  Patient notes a history of eczema and psoriasis.    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   Atopic dermatitis 08/26/2017   Elevated transaminase level 06/22/2017   Fatigue 06/22/2017   B12 deficiency 03/23/2017   Vitamin D deficiency, unspecified 03/09/2017   Family history of colon cancer - Grandmother 09/26/2014   Family history of colonic polyps, mother and father 09/26/2014   Allergic rhinitis 03/04/2007   Asthma 03/04/2007   Past Medical History:  Diagnosis Date   Allergic rhinitis    Allergy    Asthma    Hx of adenomatous polyp of colon 06/14/2019   Past Surgical History:  Procedure Laterality Date   AUGMENTATION MAMMAPLASTY     COLONOSCOPY  2016   UMBILICAL HERNIA REPAIR  2008   VAGINAL WOUND CLOSURE / REPAIR  4/10   RIGHT   Social History   Tobacco Use   Smoking status: Former    Current packs/day: 0.00    Types: Cigarettes    Quit date: 05/28/1987    Years since quitting: 35.8   Smokeless tobacco: Never  Vaping Use   Vaping status: Never Used  Substance Use Topics   Alcohol use: Yes    Alcohol/week: 0.0 standard drinks of  alcohol    Comment: occ   Drug use: No   Family History  Problem Relation Age of Onset   Hypertension Mother    Colon polyps Mother        pre-cancerous polyps   Hypertension Father    Heart disease Father    Prostate cancer Father    Colon polyps Father        pre-cancerous polyps   Breast cancer Maternal Grandmother    Colon cancer Paternal Grandmother 66   Kidney disease Neg Hx    Gallbladder disease Neg Hx    Esophageal cancer Neg Hx    Stomach cancer Neg Hx    Rectal cancer Neg Hx    Allergies  Allergen Reactions   Molds & Smuts Itching and Shortness Of Breath      ROS Negative unless stated above    Objective:     BP 118/70 (BP Location: Left Arm, Patient Position: Sitting, Cuff Size: Normal)   Pulse (!) 101   Temp 99.6 F (37.6 C) (Oral)   Ht 5' 1.25" (1.556 m)   Wt 116 lb 1.6 oz (52.7 kg)   LMP  (LMP Unknown)   SpO2 98%   BMI 21.76 kg/m  BP Readings from Last 3 Encounters:  04/10/23 118/70  02/05/23 122/78  12/19/21 120/86   Wt Readings  from Last 3 Encounters:  04/10/23 116 lb 1.6 oz (52.7 kg)  03/12/23 117 lb (53.1 kg)  02/05/23 118 lb (53.5 kg)      Physical Exam Constitutional:      Appearance: Normal appearance.  HENT:     Head: Normocephalic and atraumatic.     Nose: Nose normal.     Mouth/Throat:     Mouth: Mucous membranes are moist.  Eyes:     Extraocular Movements: Extraocular movements intact.     Conjunctiva/sclera: Conjunctivae normal.  Cardiovascular:     Rate and Rhythm: Normal rate.  Pulmonary:     Effort: Pulmonary effort is normal.  Skin:    General: Skin is warm and dry.     Findings: Erythema present.     Comments: Area of erythema, increased warmth, edema of left lateral ankle at Malleolus.  2 central punctums noted  Neurological:     Mental Status: She is alert.         No results found for any visits on 04/10/23.    Assessment & Plan:  Cellulitis of left lower extremity -     Amoxicillin-Pot  Clavulanate; Take 1 tablet by mouth in the morning and at bedtime for 7 days.  Dispense: 14 tablet; Refill: 0  Left ankle pain, unspecified chronicity -     DG Ankle Complete Left; Future -     DG Foot Complete Left; Future  Start ABX for left lateral ankle cellulitis possibly due to insect bite.  Area of erythema demarcated with skin marker.  Imaging ordered prior to appointment given description of symptoms.  Discussed s/s of worsening infection.  Given strict precautions.  Return if symptoms worsen or fail to improve.   Deeann Saint, MD

## 2023-05-01 ENCOUNTER — Telehealth: Payer: Self-pay | Admitting: Family Medicine

## 2023-05-01 NOTE — Telephone Encounter (Signed)
Saw Shelly Edwards 04/10/23 still has sx of cellulitis, requesting another round of medication. Has OV 11/25@10 

## 2023-05-04 ENCOUNTER — Encounter: Payer: Self-pay | Admitting: Family Medicine

## 2023-05-04 ENCOUNTER — Ambulatory Visit: Payer: 59 | Admitting: Family Medicine

## 2023-05-04 VITALS — BP 120/80 | HR 91 | Temp 98.3°F | Resp 12 | Ht 61.25 in | Wt 112.1 lb

## 2023-05-04 DIAGNOSIS — G43009 Migraine without aura, not intractable, without status migrainosus: Secondary | ICD-10-CM | POA: Diagnosis not present

## 2023-05-04 DIAGNOSIS — F1020 Alcohol dependence, uncomplicated: Secondary | ICD-10-CM

## 2023-05-04 DIAGNOSIS — M25473 Effusion, unspecified ankle: Secondary | ICD-10-CM | POA: Diagnosis not present

## 2023-05-04 MED ORDER — DOXYCYCLINE HYCLATE 100 MG PO TABS: 100.0000 mg | ORAL_TABLET | Freq: Two times a day (BID) | ORAL | 0 refills | Status: AC

## 2023-05-04 MED ORDER — SUMATRIPTAN SUCCINATE 50 MG PO TABS: 50.0000 mg | ORAL_TABLET | Freq: Every day | ORAL | 1 refills | Status: DC | PRN

## 2023-05-04 NOTE — Progress Notes (Signed)
ACUTE VISIT Chief Complaint  Patient presents with   Edema    Left ankle    HPI: ShellyShelly Edwards is a 52 y.o. female with a PMHx significant for asthma, IBS, atopic dermatitis, B12 deficiency, HLD, and vitamin D deficiency, who is here today complaining of edema.   HPI  Edema:  Patient complains of left ankle edema, redness, sensation of heat, and pain on 10/31, for which she was seen by Dr. Salomon Edwards on 11/1 and had an x-ray.  She says her symptoms have general improved but some swelling and redness persist.  She denies fever or chills.  She has no history of gout, but mentions she does have a history of eczema and psoriasis, and follows with dermatology.   Alcohol:  She has decreased her alcohol intake to 4-5 drinks per day.  Migraines:  Patient also complains she has had 2 migraines in the last 2 months that have woken her up at night. She endorses associated nausea and vomiting.  She says these are similar to migraines she has had had several years ago. She believes these migraines may be related to hormonal changes.  She has taken amitriptyline in the past.    Review of Systems See other pertinent positives and negatives in HPI.  Current Outpatient Medications on File Prior to Visit  Medication Sig Dispense Refill   ADVAIR DISKUS 100-50 MCG/DOSE AEPB Inhale 1 puff into the lungs 2 (two) times daily.      AMBULATORY NON FORMULARY MEDICATION Triple Rosacea Cream Azelaic acid15%/Metronidazole 1%/Ivermectin 1% 1 application as needed     Calcium Carb-Cholecalciferol (CALCIUM 600+D3 PO) Take by mouth.     Cholecalciferol (VITAMIN D PO) Take 1 tablet by mouth daily.     clobetasol (OLUX) 0.05 % topical foam APPLY TO AFFECTED AREAS OF BODY (NOT FACE) 1 2 TIMES DAILY AS NEEDED     diphenhydrAMINE (BENADRYL) 25 MG tablet Take 25 mg by mouth as needed.     fluticasone (FLONASE) 50 MCG/ACT nasal spray Place into both nostrils daily.     ondansetron (ZOFRAN-ODT) 8 MG  disintegrating tablet Take 1 tablet (8 mg total) by mouth every 8 (eight) hours as needed for nausea or vomiting. TAKE 1 TABLET BY MOUTH EVERY 8 HOURS AS NEEDED FOR NAUSEA, VOMITING OR REFRACTORY NAUSEA / VOMITING. 30 tablet 11   predniSONE (DELTASONE) 10 MG tablet 40 mg x 3 days, 20 mg x 3 days, 10 mg x 3 days 21 tablet 0   silver sulfADIAZINE (SILVADENE) 1 % cream      SUMAtriptan (IMITREX) 50 MG tablet Take 1 tablet (50 mg total) by mouth daily as needed for migraine. May repeat in 2 hours if headache persists or recurs. 10 tablet 1   tacrolimus (PROTOPIC) 0.1 % ointment      thiamine 100 MG tablet Take 1 tablet (100 mg total) by mouth daily. 90 tablet 3   triamcinolone cream (KENALOG) 0.1 %      VENTOLIN HFA 108 (90 Base) MCG/ACT inhaler Inhale 1-2 puffs into the lungs as needed. Reported on 05/29/2015 18 g 1   No current facility-administered medications on file prior to visit.    Past Medical History:  Diagnosis Date   Allergic rhinitis    Allergy    Asthma    Hx of adenomatous polyp of colon 06/14/2019   Allergies  Allergen Reactions   Molds & Smuts Itching and Shortness Of Breath    Social History   Socioeconomic History  Marital status: Single    Spouse name: Not on file   Number of children: 2   Years of education: Not on file   Highest education level: Not on file  Occupational History   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[employer]  Tobacco Use   Smoking status: Former    Current packs/day: 0.00    Types: Cigarettes    Quit date: 05/28/1987    Years since quitting: 35.9   Smokeless tobacco: Never  Vaping Use   Vaping status: Never Used  Substance and Sexual Activity   Alcohol use: Yes    Alcohol/week: 0.0 standard drinks of alcohol    Comment: occ   Drug use: No   Sexual activity: Yes    Birth control/protection: Other-see comments    Comment: VAS  Other Topics Concern   Not on file  Social History Narrative   Divorced, 2 sons    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  Physical Exam Vitals and nursing note reviewed.  Constitutional:      General: She is not in acute distress.    Appearance: She is well-developed.  HENT:     Head: Normocephalic and atraumatic.     Mouth/Throat:     Mouth: Mucous membranes are moist.     Pharynx: Oropharynx is clear.  Eyes:     Conjunctiva/sclera: Conjunctivae normal.  Cardiovascular:     Rate and Rhythm: Normal rate and regular rhythm.     Pulses:          Dorsalis pedis pulses are 2+ on the right side and 2+ on the left side.     Heart sounds: No murmur heard. Pulmonary:     Effort: Pulmonary effort is normal. No respiratory distress.     Breath sounds: Normal breath sounds.  Abdominal:     Palpations: Abdomen is soft. There is no hepatomegaly or mass.     Tenderness: There is no abdominal tenderness.  Musculoskeletal:     Left ankle: Swelling present. No ecchymosis or lacerations. Tenderness present over the lateral malleolus. Normal range of motion.     Comments: ***  Lymphadenopathy:     Cervical: No cervical adenopathy.  Skin:    General: Skin is warm.     Findings: Rash present. No erythema.     Comments: Psoriatic skin lesions scattered around ankles and distal LE's.  Neurological:     General: No focal deficit present.     Mental Status: [name], place, and time.     Cranial Nerves: No cranial nerve deficit.     Gait: Gait normal.  Psychiatric:     Comments: Well groomed, good eye contact.     ASSESSMENT AND PLAN:  Shelly Edwards was seen today for edema.   There are no diagnoses linked to this encounter.  No follow-ups on file.  I, [name], acting as a scribe for [name], MD., have documented all relevant documentation on the behalf of Shelly Hofmann [place], MD,  as directed by  Shelly Mciver [place], MD while in the presence of Fredrich Cory [place], MD.   I, [name], have reviewed all documentation for this visit. The documentation on 05/04/23 for the exam, diagnosis, procedures, and orders are all accurate and complete.  Elaine Middleton G. [place], MD  [facility]. [place] office.  Discharge Instructions   None

## 2023-05-04 NOTE — Patient Instructions (Addendum)
A few things to remember from today's visit:  Migraine without aura and without status migrainosus, not intractable - Plan: SUMAtriptan (IMITREX) 50 MG tablet  Ankle edema  I do not think you need antibiotic treatment at this time but if redness gets worse or pain reoccurs you can start Doxycycline. Elevation may help Avoid scratching area.  Monitor for new symptoms associated with headache.  If you need refills for medications you take chronically, please call your pharmacy. Do not use My Chart to request refills or for acute issues that need immediate attention. If you send a my chart message, it may take a few days to be addressed, specially if I am not in the office.  Please be sure medication list is accurate. If a new problem present, please set up appointment sooner than planned today.

## 2023-05-04 NOTE — Assessment & Plan Note (Signed)
Still drinking alcohol daily but reports decreased amount. We discussed some adverse effects of high alcohol intake. She prefers to wait until next visit for labs.

## 2023-05-06 NOTE — Assessment & Plan Note (Signed)
Reporting 2 recent headache with similar presentation to those she had in the past. Neurologic examination today normal. I do not think imaging is needed at this time. Imitrex helped in the past, so Rx sent. Clearly instructed about warning signs.

## 2023-05-26 ENCOUNTER — Ambulatory Visit: Payer: 59 | Admitting: Family Medicine

## 2023-05-26 VITALS — BP 120/80 | HR 88 | Resp 12 | Ht 61.25 in | Wt 114.1 lb

## 2023-05-26 DIAGNOSIS — Z1329 Encounter for screening for other suspected endocrine disorder: Secondary | ICD-10-CM | POA: Diagnosis not present

## 2023-05-26 DIAGNOSIS — Z0000 Encounter for general adult medical examination without abnormal findings: Secondary | ICD-10-CM

## 2023-05-26 DIAGNOSIS — E559 Vitamin D deficiency, unspecified: Secondary | ICD-10-CM | POA: Diagnosis not present

## 2023-05-26 DIAGNOSIS — E538 Deficiency of other specified B group vitamins: Secondary | ICD-10-CM | POA: Diagnosis not present

## 2023-05-26 DIAGNOSIS — Z23 Encounter for immunization: Secondary | ICD-10-CM | POA: Diagnosis not present

## 2023-05-26 DIAGNOSIS — Z13228 Encounter for screening for other metabolic disorders: Secondary | ICD-10-CM

## 2023-05-26 DIAGNOSIS — Z130 Encounter for screening for diseases of the blood and blood-forming organs and certain disorders involving the immune mechanism: Secondary | ICD-10-CM

## 2023-05-26 DIAGNOSIS — E781 Pure hyperglyceridemia: Secondary | ICD-10-CM

## 2023-05-26 LAB — BASIC METABOLIC PANEL
BUN: 9 mg/dL (ref 6–23)
CO2: 27 meq/L (ref 19–32)
Calcium: 8.4 mg/dL (ref 8.4–10.5)
Chloride: 102 meq/L (ref 96–112)
Creatinine, Ser: 0.65 mg/dL (ref 0.40–1.20)
GFR: 100.77 mL/min (ref >60.00)
Glucose, Bld: 83 mg/dL (ref 70–99)
Potassium: 4 meq/L (ref 3.5–5.1)
Sodium: 139 meq/L (ref 135–145)

## 2023-05-26 LAB — HEPATIC FUNCTION PANEL
ALT: 25 U/L (ref 0–35)
AST: 37 U/L (ref 0–37)
Albumin: 3.6 g/dL (ref 3.5–5.2)
Alkaline Phosphatase: 105 U/L (ref 39–117)
Bilirubin, Direct: 0.4 mg/dL — ABNORMAL HIGH (ref 0.0–0.3)
Total Bilirubin: 0.8 mg/dL (ref 0.2–1.2)
Total Protein: 6.4 g/dL (ref 6.0–8.3)

## 2023-05-26 LAB — LIPID PANEL
Cholesterol: 220 mg/dL — ABNORMAL HIGH (ref 0–200)
HDL: 57.1 mg/dL (ref >39.00)
Total CHOL/HDL Ratio: 4
Triglycerides: 696 mg/dL — ABNORMAL HIGH (ref 0.0–149.0)

## 2023-05-26 LAB — VITAMIN B12: Vitamin B-12: 1295 pg/mL — ABNORMAL HIGH (ref 211–911)

## 2023-05-26 LAB — VITAMIN D 25 HYDROXY (VIT D DEFICIENCY, FRACTURES): VITD: 53.44 ng/mL (ref 30.00–100.00)

## 2023-05-26 LAB — LDL CHOLESTEROL, DIRECT: Direct LDL: 80 mg/dL

## 2023-05-26 NOTE — Progress Notes (Signed)
HPI: Shelly Edwards is a 52 y.o. female with a PMHx significant for asthma, IBS, atopic dermatitis, B12 deficiency, HLD, and vitamin D deficiency, who is here today for her routine physical.  Last CPE: 01/04/2021  Exercise: Patient states she has been running, weightlifting, and walking. She exercises in some form every day.  Diet: She has been eating healthy in general and following a vegetarian diet. She eats eggs, beans, nuts, seeds, and tofu for protein.  Sleep: 7-8 hours per night.  Alcohol Use: She has been drinking 5-6 beers per day.   Smoking: She quit smoking in 1988.  Vision: She went to an eye doctor for the first time a few months ago. She is waiting for her insurance to cover her next visit.  Dental: UTD on routine dental care.  Gynecology: She hasn't seen a gynecologist for a year because of an insurance issue.   Immunization History  Administered Date(s) Administered   Influenza Inj Mdck Quad Pf 03/10/2019   Influenza, High Dose Seasonal PF 04/22/2016, 05/11/2017, 04/20/2018, 04/19/2019, 04/23/2020   Influenza-Unspecified 03/04/2017   Meningococcal Conjugate 03/31/2012   PFIZER(Purple Top)SARS-COV-2 Vaccination 08/30/2019, 09/20/2019, 04/23/2020, 06/20/2020   Pneumococcal Polysaccharide-23 01/04/2020   Tdap 01/04/2020   Typhoid Inactivated 03/31/2012   Yellow Fever 03/31/2012   Health Maintenance  Topic Date Due   INFLUENZA VACCINE  01/08/2023   COVID-19 Vaccine (5 - 2024-25 season) 02/08/2023   Zoster Vaccines- Shingrix (1 of 2) 05/16/2031 (Originally 04/29/2020)   MAMMOGRAM  08/20/2024   Cervical Cancer Screening (HPV/Pap Cotest)  05/29/2025   Colonoscopy  06/07/2026   DTaP/Tdap/Td (2 - Td or Tdap) 01/03/2030   Hepatitis C Screening  Completed   HIV Screening  Completed   HPV VACCINES  Aged Out   Chronic medical problems:  Lab Results  Component Value Date   NA 137 11/12/2021   CL 103 11/12/2021   K 4.3 11/12/2021   CO2 24 11/12/2021    BUN 7 11/12/2021   CREATININE 0.63 11/12/2021   GFR 102.63 11/12/2021   CALCIUM 9.2 11/12/2021   ALBUMIN 3.8 11/12/2021   GLUCOSE 70 11/12/2021   Lab Results  Component Value Date   ALT 11 11/12/2021   AST 18 11/12/2021   ALKPHOS 93 11/12/2021   BILITOT 0.4 11/12/2021   Hyperlipidemia:  Not currently on pharmacologic treatment.  Lab Results  Component Value Date   CHOL 220 (H) 01/04/2021   HDL 100.80 01/04/2021   LDLCALC 91 01/04/2021   TRIG 137.0 01/04/2021   CHOLHDL 2 01/04/2021   Supplements:  She is taking OTC vitamin D, calcium, and B12.   Concerns today:   Ankle rash/swelling:  Her left ankle is still occasionally swelling. She says it is tender when it swells.  It is a little bit better than last time.   She asks about her need for a shingles vaccine.   Review of Systems  Constitutional:  Negative for activity change, appetite change and fever.  HENT:  Negative for hearing loss, mouth sores, sore throat and trouble swallowing.   Eyes:  Negative for redness and visual disturbance.  Respiratory:  Negative for cough, shortness of breath and wheezing.   Cardiovascular:  Negative for chest pain and leg swelling.  Gastrointestinal:  Negative for abdominal pain, nausea and vomiting.       No changes in bowel habits.  Endocrine: Negative for cold intolerance, heat intolerance, polydipsia, polyphagia and polyuria.  Genitourinary:  Negative for decreased urine volume, dysuria, hematuria, vaginal bleeding  and vaginal discharge.  Musculoskeletal:  Negative for gait problem and myalgias.  Skin:  Negative for color change and rash.  Allergic/Immunologic: Positive for environmental allergies.  Neurological:  Negative for seizures, syncope, weakness and headaches.  Hematological:  Negative for adenopathy. Does not bruise/bleed easily.  Psychiatric/Behavioral:  Negative for confusion and hallucinations.   All other systems reviewed and are negative.   Current Outpatient  Medications on File Prior to Visit  Medication Sig Dispense Refill   ADVAIR DISKUS 100-50 MCG/DOSE AEPB Inhale 1 puff into the lungs 2 (two) times daily.      AMBULATORY NON FORMULARY MEDICATION Triple Rosacea Cream Azelaic acid15%/Metronidazole 1%/Ivermectin 1% 1 application as needed     Calcium Carb-Cholecalciferol (CALCIUM 600+D3 PO) Take by mouth.     Cholecalciferol (VITAMIN D PO) Take 1 tablet by mouth daily.     clobetasol (OLUX) 0.05 % topical foam APPLY TO AFFECTED AREAS OF BODY (NOT FACE) 1 2 TIMES DAILY AS NEEDED     diphenhydrAMINE (BENADRYL) 25 MG tablet Take 25 mg by mouth as needed.     fluticasone (FLONASE) 50 MCG/ACT nasal spray Place into both nostrils daily.     ondansetron (ZOFRAN-ODT) 8 MG disintegrating tablet Take 1 tablet (8 mg total) by mouth every 8 (eight) hours as needed for nausea or vomiting. TAKE 1 TABLET BY MOUTH EVERY 8 HOURS AS NEEDED FOR NAUSEA, VOMITING OR REFRACTORY NAUSEA / VOMITING. 30 tablet 11   predniSONE (DELTASONE) 10 MG tablet 40 mg x 3 days, 20 mg x 3 days, 10 mg x 3 days 21 tablet 0   silver sulfADIAZINE (SILVADENE) 1 % cream      SUMAtriptan (IMITREX) 50 MG tablet Take 1 tablet (50 mg total) by mouth daily as needed for migraine. May repeat in 2 hours if headache persists or recurs. 10 tablet 1   tacrolimus (PROTOPIC) 0.1 % ointment      thiamine 100 MG tablet Take 1 tablet (100 mg total) by mouth daily. 90 tablet 3   triamcinolone cream (KENALOG) 0.1 %      VENTOLIN HFA 108 (90 Base) MCG/ACT inhaler Inhale 1-2 puffs into the lungs as needed. Reported on 05/29/2015 18 g 1   No current facility-administered medications on file prior to visit.   Past Medical History:  Diagnosis Date   Allergic rhinitis    Allergy    Asthma    Hx of adenomatous polyp of colon 06/14/2019   Past Surgical History:  Procedure Laterality Date   AUGMENTATION MAMMAPLASTY     COLONOSCOPY  2016   UMBILICAL HERNIA REPAIR  2008   VAGINAL WOUND CLOSURE / REPAIR  4/10    RIGHT   Allergies  Allergen Reactions   Molds & Smuts Itching and Shortness Of Breath    Family History  Problem Relation Age of Onset   Hypertension Mother    Colon polyps Mother        pre-cancerous polyps   Hypertension Father    Heart disease Father    Prostate cancer Father    Colon polyps Father        pre-cancerous polyps   Breast cancer Maternal Grandmother    Colon cancer Paternal Grandmother 3   Kidney disease Neg Hx    Gallbladder disease Neg Hx    Esophageal cancer Neg Hx    Stomach cancer Neg Hx    Rectal cancer Neg Hx     Social History   Socioeconomic History   Marital status: Single  Spouse name: Not on file   Number of children: 2   Years of education: Not on file   Highest education level: Doctorate  Occupational History   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[employer]  Tobacco Use   Smoking status: Former    Current packs/day: 0.00    Types: Cigarettes    Quit date: 05/28/1987    Years since quitting: 36.0   Smokeless tobacco: Never  Vaping Use   Vaping status: Never Used  Substance and Sexual Activity   Alcohol use: Yes    Alcohol/week: 0.0 standard drinks of alcohol    Comment: occ   Drug use: No   Sexual activity: Yes    Birth control/protection: Other-see comments    Comment: VAS  Other Topics Concern   Not on file  Social History Narrative   Divorced, 2 sons   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   Social Drivers of Corporate investment banker Strain: Low Risk  (05/26/2023)   Overall Financial Resource Strain (CARDIA)    Difficulty of Paying Living Expenses: Not hard at all  Food Insecurity: No Food Insecurity (05/26/2023)   Hunger Vital Sign    Worried About Running Out of Food in the Last Year: Never true    Ran Out of Food in the Last Year: Never true  Transportation Needs: No Transportation Needs (05/26/2023)   PRAPARE - Administrator, Civil Service (Medical): No    Lack  of Transportation (Non-Medical): No  Physical Activity: Sufficiently Active (05/26/2023)   Exercise Vital Sign    Days of Exercise per Week: 5 days    Minutes of Exercise per Session: 30 min  Stress: No Stress Concern Present (05/26/2023)   [employer] of Occupational Health - Occupational Stress Questionnaire    Feeling of Stress : Only a little  Social Connections: Moderately Isolated (05/26/2023)   Social Connection and Isolation Panel [NHANES]    Frequency of Communication with Friends and Family: More than three times a week    Frequency of Social Gatherings with Friends and Family: Twice a week    Attends Religious Services: Never    Database administrator or Organizations: Yes    Attends Engineer, structural: More than 4 times per year    Marital Status: Divorced    Vitals:   05/26/23 1023  BP: 120/80  Pulse: 88  SpO2: 98%   Body mass index is 21.39 kg/m.  Wt Readings from Last 3 Encounters:  05/26/23 114 lb 2 oz (51.8 kg)  05/04/23 112 lb 2 oz (50.9 kg)  04/10/23 116 lb 1.6 oz (52.7 kg)    Physical Exam Vitals and nursing note reviewed.  Constitutional:      General: She is not in acute distress.    Appearance: She is well-developed.  HENT:     Head: Normocephalic and atraumatic.     Right Ear: Hearing, tympanic membrane, ear canal and external ear normal.     Left Ear: Hearing, tympanic membrane, ear canal and external ear normal.     Mouth/Throat:     Mouth: Mucous membranes are moist.     Pharynx: Oropharynx is clear. Uvula midline.  Eyes:     Extraocular Movements: Extraocular movements intact.     Conjunctiva/sclera: Conjunctivae normal.     Pupils: Pupils are equal, round, and reactive to light.  Neck:     Thyroid: No thyroid mass or thyromegaly.  Trachea: No tracheal deviation.  Cardiovascular:     Rate and Rhythm: Normal rate and regular rhythm.     Pulses:          Dorsalis pedis pulses are 2+ on the right side and 2+ on the  left side.     Heart sounds: No murmur heard. Pulmonary:     Effort: Pulmonary effort is normal. No respiratory distress.     Breath sounds: Normal breath sounds.  Abdominal:     Palpations: Abdomen is soft. There is no hepatomegaly or mass.     Tenderness: There is no abdominal tenderness.  Genitourinary:    Comments: Deferred to gyn. Musculoskeletal:     Right lower leg: No edema.     Left lower leg: No edema.     Comments: No major deformity or signs of synovitis appreciated.  Lymphadenopathy:     Cervical: No cervical adenopathy.     Upper Body:     Right upper body: No supraclavicular adenopathy.     Left upper body: No supraclavicular adenopathy.  Skin:    General: Skin is warm.     Findings: No erythema or rash.  Neurological:     General: No focal deficit present.     Mental Status: [name], place, and time.     Cranial Nerves: No cranial nerve deficit.     Coordination: Coordination normal.     Gait: Gait normal.     Deep Tendon Reflexes:     Reflex Scores:      Bicep reflexes are 2+ on the right side and 2+ on the left side.      Patellar reflexes are 2+ on the right side and 2+ on the left side. Psychiatric:     Comments: Well groomed, good eye contact.     ASSESSMENT AND PLAN:  Shelly Edwards was here today for her annual physical examination.  Orders Placed This Encounter  Procedures   Basic metabolic panel   Hepatic function panel   Lipid panel   VITAMIN D 25 Hydroxy (Vit-D Deficiency, Fractures)   Vitamin B12    @ASSESSPLAN @  Routine general medical examination at a health care facility  Screening for endocrine, metabolic and immunity disorder -     Basic metabolic panel; Future  Pure hypertriglyceridemia -     Hepatic function panel; Future -     Lipid panel; Future  Vitamin D deficiency, unspecified -     VITAMIN D 25 Hydroxy (Vit-D Deficiency, Fractures); Future  B12 deficiency -      Vitamin B12; Future    Return in 1 year (on 05/25/2024) for CPE.  I, [name], acting as a scribe for Idali Lafever [place], MD., have documented all relevant documentation on the behalf of Bernd Crom [place], MD, as directed by  Love Milbourne [place], MD while in the presence of Demecia Northway [place], MD.   I, Nahom Carfagno [place], MD, have reviewed all documentation for this visit. The documentation on 05/26/23 for the exam, diagnosis, procedures, and orders are all accurate and complete.  Leticia Coletta G. [place], MD  [facility]. [place] office.

## 2023-05-26 NOTE — Patient Instructions (Addendum)
A few things to remember from today's visit:  Routine general medical examination at a health care facility  Screening for endocrine, metabolic and immunity disorder - Plan: Basic metabolic panel  Pure hypertriglyceridemia - Plan: Hepatic function panel, Lipid panel  Vitamin D deficiency, unspecified - Plan: VITAMIN D 25 Hydroxy (Vit-D Deficiency, Fractures)  B12 deficiency - Plan: Vitamin B12  If you need refills for medications you take chronically, please call your pharmacy. Do not use My Chart to request refills or for acute issues that need immediate attention. If you send a my chart message, it may take a few days to be addressed, specially if I am not in the office.  Please be sure medication list is accurate. If a new problem present, please set up appointment sooner than planned today.  Health Maintenance, Female Adopting a healthy lifestyle and getting preventive care are important in promoting health and wellness. Ask your health care provider about: The right schedule for you to have regular tests and exams. Things you can do on your own to prevent diseases and keep yourself healthy. What should I know about diet, weight, and exercise? Eat a healthy diet  Eat a diet that includes plenty of vegetables, fruits, low-fat dairy products, and lean protein. Do not eat a lot of foods that are high in solid fats, added sugars, or sodium. Maintain a healthy weight Body mass index (BMI) is used to identify weight problems. It estimates body fat based on height and weight. Your health care provider can help determine your BMI and help you achieve or maintain a healthy weight. Get regular exercise Get regular exercise. This is one of the most important things you can do for your health. Most adults should: Exercise for at least 150 minutes each week. The exercise should increase your heart rate and make you sweat (moderate-intensity exercise). Do strengthening exercises at least twice  a week. This is in addition to the moderate-intensity exercise. Spend less time sitting. Even light physical activity can be beneficial. Watch cholesterol and blood lipids Have your blood tested for lipids and cholesterol at 52 years of age, then have this test every 5 years. Have your cholesterol levels checked more often if: Your lipid or cholesterol levels are high. You are older than 53 years of age. You are at high risk for heart disease. What should I know about cancer screening? Depending on your health history and family history, you may need to have cancer screening at various ages. This may include screening for: Breast cancer. Cervical cancer. Colorectal cancer. Skin cancer. Lung cancer. What should I know about heart disease, diabetes, and high blood pressure? Blood pressure and heart disease High blood pressure causes heart disease and increases the risk of stroke. This is more likely to develop in people who have high blood pressure readings or are overweight. Have your blood pressure checked: Every 3-5 years if you are 76-75 years of age. Every year if you are 19 years old or older. Diabetes Have regular diabetes screenings. This checks your fasting blood sugar level. Have the screening done: Once every three years after age 27 if you are at a normal weight and have a low risk for diabetes. More often and at a younger age if you are overweight or have a high risk for diabetes. What should I know about preventing infection? Hepatitis B If you have a higher risk for hepatitis B, you should be screened for this virus. Talk with your health care provider to  find out if you are at risk for hepatitis B infection. Hepatitis C Testing is recommended for: Everyone born from [birth year] through 1966. Anyone with known risk factors for hepatitis C. Sexually transmitted infections (STIs) Get screened for STIs, including gonorrhea and chlamydia, if: You are sexually active and are  younger than 53 years of age. You are older than 53 years of age and your health care provider tells you that you are at risk for this type of infection. Your sexual activity has changed since you were last screened, and you are at increased risk for chlamydia or gonorrhea. Ask your health care provider if you are at risk. Ask your health care provider about whether you are at high risk for HIV. Your health care provider may recommend a prescription medicine to help prevent HIV infection. If you choose to take medicine to prevent HIV, you should first get tested for HIV. You should then be tested every 3 months for as long as you are taking the medicine. Pregnancy If you are about to stop having your period (premenopausal) and you may become pregnant, seek counseling before you get pregnant. Take 400 to 800 micrograms (mcg) of folic acid every day if you become pregnant. Ask for birth control (contraception) if you want to prevent pregnancy. Osteoporosis and menopause Osteoporosis is a disease in which the bones lose minerals and strength with aging. This can result in bone fractures. If you are 40 years old or older, or if you are at risk for osteoporosis and fractures, ask your health care provider if you should: Be screened for bone loss. Take a calcium or vitamin D supplement to lower your risk of fractures. Be given hormone replacement therapy (HRT) to treat symptoms of menopause. Follow these instructions at home: Alcohol use Do not drink alcohol if: Your health care provider tells you not to drink. You are pregnant, may be pregnant, or are planning to become pregnant. If you drink alcohol: Limit how much you have to: 0-1 drink a day. Know how much alcohol is in your drink. In the [place] one drink equals one 12 oz bottle of beer (355 mL), one 5 oz glass of wine (148 mL), or one 1 oz glass of hard liquor (44 mL). Lifestyle Do not use any products that contain nicotine or tobacco. These  products include cigarettes, chewing tobacco, and vaping devices, such as e-cigarettes. If you need help quitting, ask your health care provider. Do not use street drugs. Do not share needles. Ask your health care provider for help if you need support or information about quitting drugs. General instructions Schedule regular health, dental, and eye exams. Stay current with your vaccines. Tell your health care provider if: You often feel depressed. You have ever been abused or do not feel safe at home. Summary Adopting a healthy lifestyle and getting preventive care are important in promoting health and wellness. Follow your health care provider's instructions about healthy diet, exercising, and getting tested or screened for diseases. Follow your health care provider's instructions on monitoring your cholesterol and blood pressure. This information is not intended to replace advice given to you by your health care provider. Make sure you discuss any questions you have with your health care provider. Document Revised: 10/15/2020 Document Reviewed: 10/15/2020 Elsevier Patient Education  2024 [employer].

## 2023-08-09 ENCOUNTER — Other Ambulatory Visit: Payer: Self-pay

## 2023-08-09 ENCOUNTER — Encounter (HOSPITAL_COMMUNITY): Payer: Self-pay

## 2023-08-09 ENCOUNTER — Emergency Department (HOSPITAL_COMMUNITY)

## 2023-08-09 ENCOUNTER — Emergency Department (HOSPITAL_COMMUNITY)
Admission: EM | Admit: 2023-08-09 | Discharge: 2023-08-09 | Disposition: A | Discharge status: Home / Self Care | Attending: Emergency Medicine | Admitting: Emergency Medicine

## 2023-08-09 DIAGNOSIS — J45909 Unspecified asthma, uncomplicated: Secondary | ICD-10-CM | POA: Insufficient documentation

## 2023-08-09 DIAGNOSIS — R1031 Right lower quadrant pain: Secondary | ICD-10-CM | POA: Insufficient documentation

## 2023-08-09 DIAGNOSIS — R197 Diarrhea, unspecified: Secondary | ICD-10-CM | POA: Diagnosis not present

## 2023-08-09 DIAGNOSIS — R1011 Right upper quadrant pain: Secondary | ICD-10-CM | POA: Diagnosis not present

## 2023-08-09 DIAGNOSIS — R112 Nausea with vomiting, unspecified: Secondary | ICD-10-CM | POA: Diagnosis not present

## 2023-08-09 DIAGNOSIS — Z7951 Long term (current) use of inhaled steroids: Secondary | ICD-10-CM | POA: Diagnosis not present

## 2023-08-09 DIAGNOSIS — Z7952 Long term (current) use of systemic steroids: Secondary | ICD-10-CM | POA: Diagnosis not present

## 2023-08-09 DIAGNOSIS — E871 Hypo-osmolality and hyponatremia: Secondary | ICD-10-CM

## 2023-08-09 DIAGNOSIS — R1013 Epigastric pain: Secondary | ICD-10-CM | POA: Diagnosis not present

## 2023-08-09 DIAGNOSIS — R109 Unspecified abdominal pain: Secondary | ICD-10-CM | POA: Diagnosis present

## 2023-08-09 DIAGNOSIS — K2980 Duodenitis without bleeding: Secondary | ICD-10-CM

## 2023-08-09 LAB — CBC WITH DIFFERENTIAL/PLATELET
Abs Immature Granulocytes: 0.04 10*3/uL (ref 0.00–0.07)
Basophils Absolute: 0.1 10*3/uL (ref 0.0–0.1)
Basophils Relative: 1 %
Eosinophils Absolute: 0 10*3/uL (ref 0.0–0.5)
Eosinophils Relative: 0 %
HCT: 33.7 % — ABNORMAL LOW (ref 36.0–46.0)
Hemoglobin: 12.3 g/dL (ref 12.0–15.0)
Immature Granulocytes: 1 %
Lymphocytes Relative: 26 %
Lymphs Abs: 1.8 10*3/uL (ref 0.7–4.0)
MCH: 36.1 pg — ABNORMAL HIGH (ref 26.0–34.0)
MCHC: 36.5 g/dL — ABNORMAL HIGH (ref 30.0–36.0)
MCV: 98.8 fL (ref 80.0–100.0)
Monocytes Absolute: 0.5 10*3/uL (ref 0.1–1.0)
Monocytes Relative: 8 %
Neutro Abs: 4.4 10*3/uL (ref 1.7–7.7)
Neutrophils Relative %: 64 %
Platelets: 142 10*3/uL — ABNORMAL LOW (ref 150–400)
RBC: 3.41 MIL/uL — ABNORMAL LOW (ref 3.87–5.11)
RDW: 12.6 % (ref 11.5–15.5)
WBC: 6.8 10*3/uL (ref 4.0–10.5)
nRBC: 0 % (ref 0.0–0.2)

## 2023-08-09 LAB — URINALYSIS, ROUTINE W REFLEX MICROSCOPIC
Bilirubin Urine: NEGATIVE
Glucose, UA: NEGATIVE mg/dL
Hgb urine dipstick: NEGATIVE
Ketones, ur: NEGATIVE mg/dL
Leukocytes,Ua: NEGATIVE
Nitrite: NEGATIVE
Protein, ur: NEGATIVE mg/dL
Specific Gravity, Urine: 1.003 — ABNORMAL LOW (ref 1.005–1.030)
pH: 7 (ref 5.0–8.0)

## 2023-08-09 LAB — COMPREHENSIVE METABOLIC PANEL
ALT: 21 U/L (ref 0–44)
AST: 43 U/L — ABNORMAL HIGH (ref 15–41)
Albumin: 2.6 g/dL — ABNORMAL LOW (ref 3.5–5.0)
Alkaline Phosphatase: 157 U/L — ABNORMAL HIGH (ref 38–126)
Anion gap: 9 (ref 5–15)
BUN: 7 mg/dL (ref 6–20)
CO2: 22 mmol/L (ref 22–32)
Calcium: 8.1 mg/dL — ABNORMAL LOW (ref 8.9–10.3)
Chloride: 95 mmol/L — ABNORMAL LOW (ref 98–111)
Creatinine, Ser: 0.47 mg/dL (ref 0.44–1.00)
GFR, Estimated: >60 mL/min (ref >60)
Glucose, Bld: 108 mg/dL — ABNORMAL HIGH (ref 70–99)
Potassium: 4.5 mmol/L (ref 3.5–5.1)
Sodium: 126 mmol/L — ABNORMAL LOW (ref 135–145)
Total Bilirubin: 1.6 mg/dL — ABNORMAL HIGH (ref 0.0–1.2)
Total Protein: 4.5 g/dL — ABNORMAL LOW (ref 6.5–8.1)

## 2023-08-09 LAB — LIPASE, BLOOD: Lipase: 83 U/L — ABNORMAL HIGH (ref 11–51)

## 2023-08-09 MED ORDER — IOHEXOL 300 MG/ML  SOLN
80.0000 mL | Freq: Once | INTRAMUSCULAR | Status: AC | PRN
  Administered 2023-08-09: 80 mL via INTRAVENOUS

## 2023-08-09 MED ORDER — HYDROCODONE-ACETAMINOPHEN 5-325 MG PO TABS
1.0000 | ORAL_TABLET | Freq: Once | ORAL | Status: AC
  Administered 2023-08-09: 1 via ORAL
  Filled 2023-08-09: qty 1

## 2023-08-09 MED ORDER — FENTANYL CITRATE PF 50 MCG/ML IJ SOSY
50.0000 ug | PREFILLED_SYRINGE | Freq: Once | INTRAMUSCULAR | Status: AC
  Administered 2023-08-09: 50 ug via INTRAVENOUS
  Filled 2023-08-09: qty 1

## 2023-08-09 MED ORDER — HYDROCODONE-ACETAMINOPHEN 5-325 MG PO TABS: 1.0000 | ORAL_TABLET | Freq: Three times a day (TID) | ORAL | 0 refills | Status: DC | PRN

## 2023-08-09 MED ORDER — AMOXICILLIN-POT CLAVULANATE 875-125 MG PO TABS
1.0000 | ORAL_TABLET | Freq: Once | ORAL | Status: AC
  Administered 2023-08-09: 1 via ORAL
  Filled 2023-08-09: qty 1

## 2023-08-09 MED ORDER — SODIUM CHLORIDE 0.9 % IV BOLUS
1000.0000 mL | Freq: Once | INTRAVENOUS | Status: AC
  Administered 2023-08-09: 1000 mL via INTRAVENOUS

## 2023-08-09 MED ORDER — ONDANSETRON HCL 4 MG/2ML IJ SOLN
4.0000 mg | Freq: Once | INTRAMUSCULAR | Status: AC
  Administered 2023-08-09: 4 mg via INTRAVENOUS
  Filled 2023-08-09: qty 2

## 2023-08-09 MED ORDER — ONDANSETRON 4 MG PO TBDP: 4.0000 mg | ORAL_TABLET | Freq: Three times a day (TID) | ORAL | 0 refills | Status: DC | PRN

## 2023-08-09 MED ORDER — AMOXICILLIN-POT CLAVULANATE 875-125 MG PO TABS: 1.0000 | ORAL_TABLET | Freq: Two times a day (BID) | ORAL | 0 refills | Status: AC

## 2023-08-09 MED ORDER — ONDANSETRON 4 MG PO TBDP
4.0000 mg | ORAL_TABLET | Freq: Once | ORAL | Status: AC
  Administered 2023-08-09: 4 mg via ORAL
  Filled 2023-08-09: qty 1

## 2023-08-09 NOTE — Discharge Instructions (Signed)
 Your blood work today showed that you have some mild dehydration, with a low sodium level or salt levels.  This is probably due to not eating much food.  I recommend that you try to increase your salt intake with some soup broths, with a liquid, broth, and light saltine or light food diet for the next 2 to 3 days.  Try to avoid Pasta, meat, or very greasy or takeout food.  Avoid alcohol.  There is some inflammation on your CT scan around your duodenum, which is part of your bowels, and also next your pancreas.  This is likely the cause of your pain.  I prescribed an antibiotic called Augmentin to help treat this inflammation.  Take this antibiotic twice a day with a small amount of food if possible.  This antibiotic can also cause some queasiness, nausea and diarrhea.  I prescribed you Norco which is an opioid narcotic to use sparingly for more severe pain.  Please be aware that opioids can cause constipation.  Do not drive after taking opioid narcotics.  Continue drinking plenty of water at home.  Please reach out to your primary care clinic to arrange for a follow-up visit or recheck of your blood test in 1 week.  Your doctor's office can recheck your pancreas enzymes, liver enzymes, and your sodium level.  If your pain is getting worse, or you cannot keep down medications, or you have persistent vomiting, please return to the emergency department.

## 2023-08-09 NOTE — ED Triage Notes (Signed)
"  Abdominal pain, center stomach since Thursday, some mild n/v/d. Pain changes in intensity with certain ways I move" per pt

## 2023-08-09 NOTE — ED Provider Notes (Signed)
 [facility] Provider Note   CSN: [id number] Arrival date & time: 08/09/23  1505     History  Chief Complaint  Patient presents with   Abdominal Pain    Shelly Edwards is a 53 y.o. female with medical history of allergies, asthma, allergic rhinitis, IBS.  Patient presents to ED for evaluation of abdominal pain.  Reports that beginning on Thursday she has had waxing waning abdominal pain located in the middle of her stomach.  She reports in the last 24 hours this pain is become more consistent.  She is endorsing nausea, vomiting and diarrhea.  Denies fevers, dysuria, flank pain, chest pain or shortness breath at home.  Denies any vaginal discharge.  Reports she was seen in urgent care and redirected to ED for evaluation.  States that she drinks alcohol daily.  Also reports that she is been taking some ibuprofen recently.   Abdominal Pain Associated symptoms: diarrhea, nausea and vomiting   Associated symptoms: no chest pain, no fever and no shortness of breath        Home Medications Prior to Admission medications   Medication Sig Start Date End Date Taking? Authorizing Provider  ADVAIR DISKUS 100-50 MCG/DOSE AEPB Inhale 1 puff into the lungs 2 (two) times daily.  08/25/14   [provider]  AMBULATORY NON FORMULARY MEDICATION Triple Rosacea Cream Azelaic acid15%/Metronidazole 1%/Ivermectin 1% 1 application as needed    [provider]  Calcium Carb-Cholecalciferol (CALCIUM 600+D3 PO) Take by mouth.    [provider]  Cholecalciferol (VITAMIN D PO) Take 1 tablet by mouth daily.    [provider]  clobetasol (OLUX) 0.05 % topical foam APPLY TO AFFECTED AREAS OF BODY (NOT FACE) 1 2 TIMES DAILY AS NEEDED 10/14/19   [provider]  diphenhydrAMINE (BENADRYL) 25 MG tablet Take 25 mg by mouth as needed.    [provider]  fluticasone (FLONASE) 50 MCG/ACT nasal spray Place into  both nostrils daily.    [provider]  ondansetron (ZOFRAN-ODT) 8 MG disintegrating tablet Take 1 tablet (8 mg total) by mouth every 8 (eight) hours as needed for nausea or vomiting. TAKE 1 TABLET BY MOUTH EVERY 8 HOURS AS NEEDED FOR NAUSEA, VOMITING OR REFRACTORY NAUSEA / VOMITING. 02/05/23   Unk Lightning, PA  predniSONE (DELTASONE) 10 MG tablet 40 mg x 3 days, 20 mg x 3 days, 10 mg x 3 days 03/12/23   Shirline Frees, NP  silver sulfADIAZINE (SILVADENE) 1 % cream  12/22/18   [provider]  SUMAtriptan (IMITREX) 50 MG tablet Take 1 tablet (50 mg total) by mouth daily as needed for migraine. May repeat in 2 hours if headache persists or recurs. 05/04/23   [place], Betty G, MD  tacrolimus (PROTOPIC) 0.1 % ointment  05/25/20   [provider]  thiamine 100 MG tablet Take 1 tablet (100 mg total) by mouth daily. 11/30/19   [place], Betty G, MD  triamcinolone cream (KENALOG) 0.1 %  12/22/18   [provider]  VENTOLIN HFA 108 (90 Base) MCG/ACT inhaler Inhale 1-2 puffs into the lungs as needed. Reported on 05/29/2015 07/23/22   [place], Betty G, MD      Allergies    Molds & smuts    Review of Systems   Review of Systems  Constitutional:  Negative for fever.  Respiratory:  Negative for shortness of breath.   Cardiovascular:  Negative for chest pain.  Gastrointestinal:  Positive for  abdominal pain, diarrhea, nausea and vomiting.  All other systems reviewed and are negative.   Physical Exam Updated Vital Signs BP 127/89   Pulse 89   Temp 98.4 F (36.9 C) (Oral)   Resp 17   Ht 5' 1.25" (1.556 m)   Wt 49.9 kg   SpO2 100%   BMI 20.61 kg/m  Physical Exam Vitals and nursing note reviewed.  Constitutional:      General: She is not in acute distress.    Appearance: Normal appearance. She is not ill-appearing, toxic-appearing or diaphoretic.  HENT:     Head: Normocephalic and atraumatic.     Nose: Nose normal.     Mouth/Throat:     Mouth: Mucous  membranes are moist.     Pharynx: Oropharynx is clear.  Eyes:     Extraocular Movements: Extraocular movements intact.     Conjunctiva/sclera: Conjunctivae normal.     Pupils: Pupils are equal, round, and reactive to light.  Cardiovascular:     Rate and Rhythm: Normal rate and regular rhythm.  Pulmonary:     Effort: Pulmonary effort is normal.     Breath sounds: Normal breath sounds. No wheezing.  Abdominal:     General: Abdomen is flat. Bowel sounds are normal.     Palpations: Abdomen is soft.     Tenderness: There is abdominal tenderness. There is no right CVA tenderness or left CVA tenderness.       Comments: Right upper quadrant, right lower quadrant tenderness.  Also has epigastric tenderness.  No rebound or guarding.  No CVA tenderness bilaterally.  Musculoskeletal:     Cervical back: Normal range of motion and neck supple. No tenderness.  Skin:    Capillary Refill: Capillary refill takes less than 2 seconds.  Neurological:     Mental Status: [name], place, and time.     ED Results / Procedures / Treatments   Labs (all labs ordered are listed, but only abnormal results are displayed) Labs Reviewed  CBC WITH DIFFERENTIAL/PLATELET - Abnormal; Notable for the following components:      Result Value   RBC 3.41 (*)    HCT 33.7 (*)    MCH 36.1 (*)    MCHC 36.5 (*)    Platelets 142 (*)    All other components within normal limits  COMPREHENSIVE METABOLIC PANEL  LIPASE, BLOOD  URINALYSIS, ROUTINE W REFLEX MICROSCOPIC    EKG None  Radiology No results found.  Procedures Procedures   Medications Ordered in ED Medications  ondansetron (ZOFRAN) injection 4 mg (4 mg Intravenous Given 08/09/23 1707)  fentaNYL (SUBLIMAZE) injection 50 mcg (50 mcg Intravenous Given 08/09/23 1707)    ED Course/ Medical Decision Making/ A&P Clinical Course as of 08/09/23 1817  Sun Aug 09, 2023  1808 53 yo female here with abdominal pain, RUQ and RLQ x 3 days,  nausea, 1 episode vomiting, loose stools, no dysuria.  No hx of abdominal surgeries.  On exam vitals wnl;  TTP in RUQ and RLQ.  Ddx includes appendicitis vs biliary disease vs other.  She took GI meds and advil at home with no relief.  Pending Labs, CT imaging. [MT]    Clinical Course User Index [MT] Trifan, Kermit Balo, MD   Medical Decision Making Amount and/or Complexity of Data Reviewed Labs: ordered. Radiology: ordered.  Risk Prescription drug management.   53 year old female presents for evaluation.  Please see HPI for further details.  On exam patient is afebrile  and nontachycardic.  Abdomen soft and compressible with tenderness in the right upper quadrant, right lower quadrant, epigastric regions.  No rebound or guarding.  No CVA tenderness.  Mental status at baseline.  Will treat patient pain with fentanyl, nausea with Zofran.  Will assess with labs and CT scan.  CBC without leukocytosis or anemia.  CMP, urinalysis, lipase pending at this time.  CT scan abdomen pelvis pending at this time.  Patient signed out to attending Dr. Renaye Rakers.   Final Clinical Impression(s) / ED Diagnoses Final diagnoses:  Right lower quadrant abdominal pain  Right upper quadrant abdominal pain  Epigastric abdominal pain    Rx / DC Orders ED Discharge Orders     None         Clent Ridges 08/09/23 1817    Terald Sleeper, MD 08/09/23 2020

## 2023-08-18 ENCOUNTER — Ambulatory Visit (INDEPENDENT_AMBULATORY_CARE_PROVIDER_SITE_OTHER): Admitting: Family Medicine

## 2023-08-18 ENCOUNTER — Encounter: Payer: Self-pay | Admitting: Family Medicine

## 2023-08-18 VITALS — BP 100/70 | HR 98 | Resp 16 | Ht 61.25 in | Wt 109.1 lb

## 2023-08-18 DIAGNOSIS — E871 Hypo-osmolality and hyponatremia: Secondary | ICD-10-CM

## 2023-08-18 DIAGNOSIS — R1013 Epigastric pain: Secondary | ICD-10-CM | POA: Diagnosis not present

## 2023-08-18 DIAGNOSIS — R748 Abnormal levels of other serum enzymes: Secondary | ICD-10-CM | POA: Diagnosis not present

## 2023-08-18 DIAGNOSIS — R350 Frequency of micturition: Secondary | ICD-10-CM | POA: Diagnosis not present

## 2023-08-18 DIAGNOSIS — G43009 Migraine without aura, not intractable, without status migrainosus: Secondary | ICD-10-CM

## 2023-08-18 DIAGNOSIS — E781 Pure hyperglyceridemia: Secondary | ICD-10-CM | POA: Diagnosis not present

## 2023-08-18 DIAGNOSIS — F1020 Alcohol dependence, uncomplicated: Secondary | ICD-10-CM

## 2023-08-18 LAB — LIPID PANEL
Cholesterol: 378 mg/dL — ABNORMAL HIGH (ref 0–200)
HDL: 61.7 mg/dL (ref >39.00)
LDL Cholesterol: 304 mg/dL — ABNORMAL HIGH (ref 0–99)
NonHDL: 316.79
Total CHOL/HDL Ratio: 6
Triglycerides: 63 mg/dL (ref 0.0–149.0)
VLDL: 12.6 mg/dL (ref 0.0–40.0)

## 2023-08-18 LAB — CBC
HCT: 36.3 % (ref 36.0–46.0)
Hemoglobin: 11.7 g/dL — ABNORMAL LOW (ref 12.0–15.0)
MCHC: 32.3 g/dL (ref 30.0–36.0)
MCV: 102.6 fl — ABNORMAL HIGH (ref 78.0–100.0)
Platelets: 383 10*3/uL (ref 150.0–400.0)
RBC: 3.54 Mil/uL — ABNORMAL LOW (ref 3.87–5.11)
RDW: 13.6 % (ref 11.5–15.5)
WBC: 5.3 10*3/uL (ref 4.0–10.5)

## 2023-08-18 LAB — C-REACTIVE PROTEIN: CRP: <1 mg/dL (ref 0.5–20.0)

## 2023-08-18 LAB — BASIC METABOLIC PANEL
BUN: 6 mg/dL (ref 6–23)
CO2: 27 meq/L (ref 19–32)
Calcium: 8.8 mg/dL (ref 8.4–10.5)
Chloride: 101 meq/L (ref 96–112)
Creatinine, Ser: 0.54 mg/dL (ref 0.40–1.20)
GFR: 105.2 mL/min (ref >60.00)
Glucose, Bld: 91 mg/dL (ref 70–99)
Potassium: 4 meq/L (ref 3.5–5.1)
Sodium: 134 meq/L — ABNORMAL LOW (ref 135–145)

## 2023-08-18 LAB — HEPATIC FUNCTION PANEL
ALT: 24 U/L (ref 0–35)
AST: 38 U/L — ABNORMAL HIGH (ref 0–37)
Albumin: 3.4 g/dL — ABNORMAL LOW (ref 3.5–5.2)
Alkaline Phosphatase: 114 U/L (ref 39–117)
Bilirubin, Direct: 0.5 mg/dL — ABNORMAL HIGH (ref 0.0–0.3)
Total Bilirubin: 0.5 mg/dL (ref 0.2–1.2)
Total Protein: 6.4 g/dL (ref 6.0–8.3)

## 2023-08-18 LAB — LIPASE: Lipase: 54 U/L (ref 11.0–59.0)

## 2023-08-18 NOTE — Assessment & Plan Note (Signed)
 Currently she is on nonpharmacologic treatment. Further recommendation will be given according to lipid results.

## 2023-08-18 NOTE — Patient Instructions (Addendum)
 A few things to remember from today's visit:  Hyponatremia - Plan: Basic metabolic panel, C-reactive protein  Epigastric abdominal pain  Elevated lipase - Plan: Hepatic function panel, C-reactive protein, Lipase, CBC  Urinary frequency - Plan: Urinalysis with Culture Reflex  Pure hypertriglyceridemia - Plan: Lipid panel  No changes today. Avoid Ibuprofen. Try Tylenol 500 mg up to 4 tabs daily as needed. Monistat over the counter for possible yeast infection. Daily probiotic, Align 1 cap daily for 20-30 days for diarrhea.  If you need refills for medications you take chronically, please call your pharmacy. Do not use My Chart to request refills or for acute issues that need immediate attention. If you send a my chart message, it may take a few days to be addressed, specially if I am not in the office.  Please be sure medication list is accurate. If a new problem present, please set up appointment sooner than planned today.

## 2023-08-18 NOTE — Assessment & Plan Note (Signed)
 Avoid taking Ibuprofen to prevent migraines. Can take Tylenol 500 mg up to 4 tabs daily but for treatment. We could consider prophylactic treatment with Topamax or amitriptyline if episodes become more frequent.

## 2023-08-18 NOTE — Assessment & Plan Note (Signed)
 She has decreased alcohol intake, encouraged to continue weaning off use. Adverse effects discussed.

## 2023-08-18 NOTE — Progress Notes (Signed)
 ACUTE VISIT Chief Complaint  Patient presents with   Follow-up   HPI: Shelly Edwards is a 53 y.o. female with a PMHx significant for asthma, IBS, B12 deficiency, HLD, and vitamin D deficiency, who is here today for hospital follow up.  Patient was seen in the ED on 3/2 with epigastric abdominal pain. She was given a course of amoxicillin, Hydrocodone-acetaminophen 5-325 mg to take every 8 hours as needed, and Zofran 4 mg to take every 8 hours as needed.   Currently, she says her pain is mostly gone. She only took the Hydrocodone-acetaminophen once. She has completed the amoxicillin. She mentions she has had some increased urinary frequency and very mild burning while urinating. Negative for vaginal discharge or bleeding. Minimal pruritus.   Also having some diarrhea since starting the amoxicillin. She is having 3-5 stools in a day. No blood in her stools.  Alcohol: She is having three drinks per day, wine and beer usually. She has decreased alcohol intake..  Denies fever, chills, changes in appetite, or difficulty swallowing.   Lab Results  Component Value Date   WBC 6.8 08/09/2023   HGB 12.3 08/09/2023   HCT 33.7 (L) 08/09/2023   MCV 98.8 08/09/2023   PLT 142 (L) 08/09/2023   Lab Results  Component Value Date   ALT 21 08/09/2023   AST 43 (H) 08/09/2023   ALKPHOS 157 (H) 08/09/2023   BILITOT 1.6 (H) 08/09/2023   Lab Results  Component Value Date   NA 126 (L) 08/09/2023   CL 95 (L) 08/09/2023   K 4.5 08/09/2023   CO2 22 08/09/2023   BUN 7 08/09/2023   CREATININE 0.47 08/09/2023   GFRNONAA >60 08/09/2023   CALCIUM 8.1 (L) 08/09/2023   ALBUMIN 2.6 (L) 08/09/2023   GLUCOSE 108 (H) 08/09/2023   She also mentions she has been taking ibuprofen before bed somewhat regularly in an effort to prevent headaches when she wakes up.  She has been getting more headaches recently.  Migraine on Imitrex. No new associated symptoms.  Abdominal and Pelvic CT from  08/09/2023 Impression:  1.) Moderate fluid within the bilateral anterior pararenal spaces with inflammatory changes between the proximal pancreas and second portion of the duodenum, some wall thickening of the duodenum as well. Findings suspicious for pancreatitis/duodenitis. Correlate with appropriate enzymes. Mild wall thickening and inflammation of hepatic flexure, probably reactive but correlate for any history of diarrhea or colitis type symptoms.  2.) Hepatic steatosis 3.) Distended gallbladder without stone 4.) Small volume free fluid in the pelvis  Hyperlipidemia:  Not currently on pharmacologic treatment.  Rosuvastatin was recommended, she wanted to try with no medication first. Lab Results  Component Value Date   CHOL 220 (H) 05/26/2023   HDL 57.10 05/26/2023   LDLCALC 91 01/04/2021   LDLDIRECT 80.0 05/26/2023   TRIG (H) 05/26/2023    696.0 Triglyceride is over 400; calculations on Lipids are invalid.   CHOLHDL 4 05/26/2023   She has also been following with dermatology on options for her psoriasis, a biologic medication has been considered.  Review of Systems See other pertinent positives and negatives in HPI.  Current Outpatient Medications on File Prior to Visit  Medication Sig Dispense Refill   ADVAIR DISKUS 100-50 MCG/DOSE AEPB Inhale 1 puff into the lungs 2 (two) times daily.      AMBULATORY NON FORMULARY MEDICATION Triple Rosacea Cream Azelaic acid15%/Metronidazole 1%/Ivermectin 1% 1 application as needed     Calcium Carb-Cholecalciferol (CALCIUM 600+D3 PO) Take  by mouth.     Cholecalciferol (VITAMIN D PO) Take 1 tablet by mouth daily.     clobetasol (OLUX) 0.05 % topical foam APPLY TO AFFECTED AREAS OF BODY (NOT FACE) 1 2 TIMES DAILY AS NEEDED     diphenhydrAMINE (BENADRYL) 25 MG tablet Take 25 mg by mouth as needed.     fluticasone (FLONASE) 50 MCG/ACT nasal spray Place into both nostrils daily.     HYDROcodone-acetaminophen (NORCO/VICODIN) 5-325 MG tablet Take 1  tablet by mouth every 8 (eight) hours as needed for up to 15 doses. 15 tablet 0   ondansetron (ZOFRAN-ODT) 4 MG disintegrating tablet Take 1 tablet (4 mg total) by mouth every 8 (eight) hours as needed for up to 12 doses for nausea or vomiting. 12 tablet 0   ondansetron (ZOFRAN-ODT) 8 MG disintegrating tablet Take 1 tablet (8 mg total) by mouth every 8 (eight) hours as needed for nausea or vomiting. TAKE 1 TABLET BY MOUTH EVERY 8 HOURS AS NEEDED FOR NAUSEA, VOMITING OR REFRACTORY NAUSEA / VOMITING. 30 tablet 11   predniSONE (DELTASONE) 10 MG tablet 40 mg x 3 days, 20 mg x 3 days, 10 mg x 3 days 21 tablet 0   silver sulfADIAZINE (SILVADENE) 1 % cream      SUMAtriptan (IMITREX) 50 MG tablet Take 1 tablet (50 mg total) by mouth daily as needed for migraine. May repeat in 2 hours if headache persists or recurs. 10 tablet 1   tacrolimus (PROTOPIC) 0.1 % ointment      thiamine 100 MG tablet Take 1 tablet (100 mg total) by mouth daily. 90 tablet 3   triamcinolone cream (KENALOG) 0.1 %      VENTOLIN HFA 108 (90 Base) MCG/ACT inhaler Inhale 1-2 puffs into the lungs as needed. Reported on 05/29/2015 18 g 1   No current facility-administered medications on file prior to visit.    Past Medical History:  Diagnosis Date   Allergic rhinitis    Allergy    Asthma    Hx of adenomatous polyp of colon 06/14/2019   Allergies  Allergen Reactions   Molds & Smuts Itching and Shortness Of Breath    Social History   Socioeconomic History   Marital status: Single    Spouse name: Not on file   Number of children: 2   Years of education: Not on file   Highest education level: Doctorate  Occupational History   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[employer]  Tobacco Use   Smoking status: Former    Current packs/day: 0.00    Types: Cigarettes    Quit date: 05/28/1987    Years since quitting: 36.2   Smokeless tobacco: Never  Vaping Use   Vaping status: Never Used  Substance and Sexual  Activity   Alcohol use: Yes    Alcohol/week: 0.0 standard drinks of alcohol    Comment: occ   Drug use: No   Sexual activity: Yes    Birth control/protection: Other-see comments    Comment: VAS  Other Topics Concern   Not on file  Social History Narrative   Divorced, 2 sons   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   Social Drivers of Health   Financial Resource Strain: Low Risk  (05/26/2023)   Overall Financial Resource Strain (CARDIA)    Difficulty of Paying Living Expenses: Not hard at all  Food Insecurity: No Food Insecurity (05/26/2023)   Hunger Vital Sign    Worried About Running  Out of Food in the Last Year: Never true    Ran Out of Food in the Last Year: Never true  Transportation Needs: No Transportation Needs (05/26/2023)   PRAPARE - Administrator, Civil Service (Medical): No    Lack of Transportation (Non-Medical): No  Physical Activity: Sufficiently Active (05/26/2023)   Exercise Vital Sign    Days of Exercise per Week: 5 days    Minutes of Exercise per Session: 30 min  Stress: No Stress Concern Present (05/26/2023)   [employer] of Occupational Health - Occupational Stress Questionnaire    Feeling of Stress : Only a little  Social Connections: Moderately Isolated (05/26/2023)   Social Connection and Isolation Panel [NHANES]    Frequency of Communication with Friends and Family: More than three times a week    Frequency of Social Gatherings with Friends and Family: Twice a week    Attends Religious Services: Never    Database administrator or Organizations: Yes    Attends Engineer, structural: More than 4 times per year    Marital Status: Divorced   Vitals:   08/18/23 0859  BP: 100/70  Pulse: 98  Resp: 16  SpO2: 97%   Body mass index is 20.45 kg/m.  Physical Exam Vitals and nursing note reviewed.  Constitutional:      General: She is not in acute distress.    Appearance: She is well-developed.  HENT:     Head:  Normocephalic and atraumatic.     Mouth/Throat:     Mouth: Mucous membranes are moist.     Pharynx: Oropharynx is clear.  Eyes:     Conjunctiva/sclera: Conjunctivae normal.  Cardiovascular:     Rate and Rhythm: Normal rate and regular rhythm.     Pulses:          Posterior tibial pulses are 2+ on the right side and 2+ on the left side.     Heart sounds: No murmur heard. Pulmonary:     Effort: Pulmonary effort is normal. No respiratory distress.     Breath sounds: Normal breath sounds.  Abdominal:     Palpations: Abdomen is soft. There is no hepatomegaly or mass.     Tenderness: There is no abdominal tenderness.  Musculoskeletal:     Right lower leg: No edema.     Left lower leg: No edema.  Lymphadenopathy:     Cervical: No cervical adenopathy.  Skin:    General: Skin is warm.     Findings: No erythema or rash.  Neurological:     General: No focal deficit present.     Mental Status: [name], place, and time.     Cranial Nerves: No cranial nerve deficit.     Gait: Gait normal.  Psychiatric:     Comments: Well groomed, good eye contact.    ASSESSMENT AND PLAN:  Ms. Crosland was seen today for hospital follow up for abdominal pain.   Hyponatremia -     Basic metabolic panel; Future -     C-reactive protein; Future  Epigastric abdominal pain  Elevated lipase -     Hepatic function panel; Future -     C-reactive protein; Future -     Lipase; Future -     CBC; Future  Urinary frequency -     Urinalysis w microscopic + reflex cultur; Future  Pure hypertriglyceridemia Assessment & Plan: Currently she is on nonpharmacologic treatment. Further recommendation will be  given according to lipid results.  Orders: -     Lipid panel; Future  Migraine without aura and without status migrainosus, not intractable Assessment & Plan: Avoid taking Ibuprofen to prevent migraines. Can take Tylenol 500 mg up to 4 tabs daily but for treatment. We could  consider prophylactic treatment with Topamax or amitriptyline if episodes become more frequent.   Alcohol use disorder, moderate, dependence (HCC) Assessment & Plan: She has decreased alcohol intake, encouraged to continue weaning off use. Adverse effects discussed.     Return if symptoms worsen or fail to improve, for keep next appointment.  I, Rolla Etienne Wierda, acting as a scribe for Cj Edgell [place], MD., have documented all relevant documentation on the behalf of Kerin Kren [place], MD, as directed by  Baily Hovanec [place], MD while in the presence of Starlena Beil [place], MD.   I, Roseanne Juenger [place], MD, have reviewed all documentation for this visit. The documentation on 08/18/23 for the exam, diagnosis, procedures, and orders are all accurate and complete.  Ritisha Deitrick G. [place], MD  [facility]. [place] office.  Discharge Instructions   None

## 2023-08-19 LAB — URINALYSIS W MICROSCOPIC + REFLEX CULTURE
Bacteria, UA: NONE SEEN /HPF
Bilirubin Urine: NEGATIVE
Glucose, UA: NEGATIVE
Hgb urine dipstick: NEGATIVE
Hyaline Cast: NONE SEEN /LPF
Ketones, ur: NEGATIVE
Leukocyte Esterase: NEGATIVE
Nitrites, Initial: NEGATIVE
Protein, ur: NEGATIVE
RBC / HPF: NONE SEEN /HPF (ref 0–2)
Specific Gravity, Urine: 1.01 (ref 1.001–1.035)
Squamous Epithelial / HPF: NONE SEEN /HPF (ref <=5)
WBC, UA: NONE SEEN /HPF (ref 0–5)
pH: 8 (ref 5.0–8.0)

## 2023-08-19 LAB — NO CULTURE INDICATED

## 2023-08-21 ENCOUNTER — Other Ambulatory Visit: Payer: Self-pay

## 2023-08-21 MED ORDER — ROSUVASTATIN CALCIUM 10 MG PO TABS: 10.0000 mg | ORAL_TABLET | Freq: Every day | ORAL | 3 refills | Status: AC

## 2023-09-28 ENCOUNTER — Other Ambulatory Visit (HOSPITAL_COMMUNITY): Payer: Self-pay | Admitting: Obstetrics and Gynecology

## 2023-09-28 DIAGNOSIS — Z136 Encounter for screening for cardiovascular disorders: Secondary | ICD-10-CM

## 2023-10-19 ENCOUNTER — Ambulatory Visit (HOSPITAL_COMMUNITY)
Admission: RE | Admit: 2023-10-19 | Discharge: 2023-10-19 | Disposition: A | Discharge status: Home / Self Care | Payer: Self-pay | Source: Ambulatory Visit | Attending: Obstetrics and Gynecology | Admitting: Obstetrics and Gynecology

## 2023-10-19 DIAGNOSIS — Z136 Encounter for screening for cardiovascular disorders: Secondary | ICD-10-CM | POA: Insufficient documentation

## 2023-11-05 ENCOUNTER — Other Ambulatory Visit: Payer: Self-pay | Admitting: Family Medicine

## 2023-11-05 DIAGNOSIS — G43009 Migraine without aura, not intractable, without status migrainosus: Secondary | ICD-10-CM

## 2024-01-06 ENCOUNTER — Encounter: Payer: Self-pay | Admitting: Family Medicine

## 2024-01-06 ENCOUNTER — Ambulatory Visit (INDEPENDENT_AMBULATORY_CARE_PROVIDER_SITE_OTHER): Admitting: Family Medicine

## 2024-01-06 VITALS — BP 128/80 | HR 88 | Resp 16 | Ht 61.25 in | Wt 111.5 lb

## 2024-01-06 DIAGNOSIS — E785 Hyperlipidemia, unspecified: Secondary | ICD-10-CM

## 2024-01-06 DIAGNOSIS — B9689 Other specified bacterial agents as the cause of diseases classified elsewhere: Secondary | ICD-10-CM

## 2024-01-06 DIAGNOSIS — N760 Acute vaginitis: Secondary | ICD-10-CM | POA: Diagnosis not present

## 2024-01-06 DIAGNOSIS — R7401 Elevation of levels of liver transaminase levels: Secondary | ICD-10-CM

## 2024-01-06 MED ORDER — METRONIDAZOLE 0.75 % VA GEL: 1.0000 | Freq: Every day | VAGINAL | 0 refills | Status: AC

## 2024-01-06 NOTE — Patient Instructions (Addendum)
 A few things to remember from today's visit:  Bacterial vaginosis Try vaginal metronidazole . If not resolved, we will need a pelvic exam and further testing.  If you need refills for medications you take chronically, please call your pharmacy. Do not use My Chart to request refills or for acute issues that need immediate attention. If you send a my chart message, it may take a few days to be addressed, specially if I am not in the office.  Please be sure medication list is accurate. If a new problem present, please set up appointment sooner than planned today.

## 2024-01-06 NOTE — Assessment & Plan Note (Signed)
 Mild. Recommend decreasing alcohol intake. Will add to next blood work.

## 2024-01-06 NOTE — Progress Notes (Signed)
 ACUTE VISIT Chief Complaint  Patient presents with   Vaginal Discharge    Mild noticeable discharge odor, boyfriend noticed it this past weekend    Discussed the use of AI scribe software for clinical note transcription with the patient, who gave verbal consent to proceed. History of Present Illness [name] is a 53 y.o. female with a PMHx significant for asthma, IBS, B12 deficiency, HLD, and vitamin D  deficiency, who is here today complaining of Vaginal Discharge.  As mentioned above, there is a mild vaginal odor, which her boyfriend noticed this past weekend. Odor described as fishy, and she says that she perceives the odor faintly, possibly due to nasal congestion, otherwise no unusual vaginal discharge noted.  Denies itching, burning, or significant pelvic pain. However there is a dull ache in her lower abdomen, which she attributes to travel-related routine changes.  Some changes in menstrual cycle, noted that her recent menstrual cycle was very light, beginning with spotting on 12/25/2023 and lasted about four days.  Her menstrual cycles have been irregular, with the last significant spotting occurring a month prior and no periods since December 2024.  Hx of yeast infections and UTIs but does not believe her current symptoms align with those conditions.  No diagnosis of STDs in the past and denies consistent pain during intercourse, only occasional discomfort.  Drinks alcohol daily. Her liver function was noted to be mildly abnormal last time it was checked.  Negative for jaundice, nausea, vomiting, changes in bowel habits, blood in stool or melena.  Lab Results  Component Value Date   ALT 24 08/18/2023   AST 38 (H) 08/18/2023   ALKPHOS 114 08/18/2023   BILITOT 0.5 08/18/2023    HLD: She states taking Rosuvastatin  10 mg daily a few months ago. She has tolerated well. Lab Results  Component Value Date   CHOL 378 (H) 08/18/2023   HDL 61.70 08/18/2023    LDLCALC 304 (H) 08/18/2023   LDLDIRECT 80.0 05/26/2023   TRIG 63.0 08/18/2023   CHOLHDL 6 08/18/2023   Review of Systems  Constitutional:  Negative for activity change, appetite change, chills and fever.  HENT:  Negative for sore throat and trouble swallowing.   Respiratory:  Negative for cough, shortness of breath and wheezing.   Cardiovascular:  Negative for chest pain, palpitations and leg swelling.  Genitourinary:  Positive for menstrual problem. Negative for decreased urine volume, hematuria and vaginal bleeding.  Skin:  Negative for rash.  Neurological:  Negative for syncope, weakness and headaches.  See other pertinent positives and negatives in HPI.  Current Outpatient Medications on File Prior to Visit  Medication Sig Dispense Refill   ADVAIR DISKUS 100-50 MCG/DOSE AEPB Inhale 1 puff into the lungs 2 (two) times daily.      AMBULATORY NON FORMULARY MEDICATION Triple Rosacea Cream Azelaic acid15%/Metronidazole  1%/Ivermectin 1% 1 application as needed     Calcium  Carb-Cholecalciferol (CALCIUM  600+D3 PO) Take by mouth.     Cholecalciferol (VITAMIN D  PO) Take 1 tablet by mouth daily.     clobetasol (OLUX) 0.05 % topical foam APPLY TO AFFECTED AREAS OF BODY (NOT FACE) 1 2 TIMES DAILY AS NEEDED     diphenhydrAMINE (BENADRYL) 25 MG tablet Take 25 mg by mouth as needed.     fluticasone (FLONASE) 50 MCG/ACT nasal spray Place into both nostrils daily.     rosuvastatin  (CRESTOR ) 10 MG tablet Take 1 tablet (10 mg total) by mouth daily. 90 tablet 3   silver sulfADIAZINE (SILVADENE)  1 % cream      SUMAtriptan  (IMITREX ) 50 MG tablet TAKE 1 TAB BY MOUTH DAILY AS NEEDED FOR MIGRAINE. MAY REPEAT IN 2 HRS IF HEADACHE PERSISTS OR RECURS 9 tablet 2   tacrolimus (PROTOPIC) 0.1 % ointment      thiamine  100 MG tablet Take 1 tablet (100 mg total) by mouth daily. 90 tablet 3   triamcinolone cream (KENALOG) 0.1 %      VENTOLIN  HFA 108 (90 Base) MCG/ACT inhaler Inhale 1-2 puffs into the lungs as  needed. Reported on 05/29/2015 18 g 1   No current facility-administered medications on file prior to visit.    Past Medical History:  Diagnosis Date   Allergic rhinitis    Allergy    Asthma    Hx of adenomatous polyp of colon 06/14/2019   Substance abuse (HCC)    Allergies  Allergen Reactions   Molds & Smuts Itching and Shortness Of Breath    Social History   Socioeconomic History   Marital status: Single    Spouse name: Not on file   Number of children: 2   Years of education: Not on file   Highest education level: Doctorate  Occupational History   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[employer]  Tobacco Use   Smoking status: Former    Current packs/day: 0.00    Types: Cigarettes    Quit date: 05/28/1987    Years since quitting: 36.6   Smokeless tobacco: Never  Vaping Use   Vaping status: Never Used  Substance and Sexual Activity   Alcohol use: Yes    Alcohol/week: 0.0 standard drinks of alcohol    Comment: occ   Drug use: No   Sexual activity: Yes    Birth control/protection: Other-see comments    Comment: VAS  Other Topics Concern   Not on file  Social History Narrative   Divorced, 2 sons   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   Social Drivers of Corporate investment banker Strain: Low Risk  (05/26/2023)   Overall Financial Resource Strain (CARDIA)    Difficulty of Paying Living Expenses: Not hard at all  Food Insecurity: No Food Insecurity (05/26/2023)   Hunger Vital Sign    Worried About Running Out of Food in the Last Year: Never true    Ran Out of Food in the Last Year: Never true  Transportation Needs: No Transportation Needs (05/26/2023)   PRAPARE - Administrator, Civil Service (Medical): No    Lack of Transportation (Non-Medical): No  Physical Activity: Sufficiently Active (05/26/2023)   Exercise Vital Sign    Days of Exercise per Week: 5 days    Minutes of Exercise per Session: 30 min  Stress: No  Stress Concern Present (05/26/2023)   [employer] of Occupational Health - Occupational Stress Questionnaire    Feeling of Stress : Only a little  Social Connections: Moderately Isolated (05/26/2023)   Social Connection and Isolation Panel    Frequency of Communication with Friends and Family: More than three times a week    Frequency of Social Gatherings with Friends and Family: Twice a week    Attends Religious Services: Never    Database administrator or Organizations: Yes    Attends Banker Meetings: More than 4 times per year    Marital Status: Divorced    Vitals:   01/06/24 1553  BP: 128/80  Pulse: 88  Resp: 16  SpO2: 98%   Body mass index is 20.9 kg/m.  Physical Exam Vitals and nursing note reviewed.  Constitutional:      General: She is not in acute distress.    Appearance: She is well-developed.  HENT:     Head: Normocephalic and atraumatic.  Eyes:     Conjunctiva/sclera: Conjunctivae normal.  Cardiovascular:     Rate and Rhythm: Normal rate and regular rhythm.     Pulses:          Dorsalis pedis pulses are 2+ on the right side and 2+ on the left side.  Pulmonary:     Effort: Pulmonary effort is normal. No respiratory distress.     Breath sounds: Normal breath sounds.  Abdominal:     Palpations: Abdomen is soft. There is no mass.     Tenderness: There is no abdominal tenderness. There is no right CVA tenderness or left CVA tenderness.  Genitourinary:    Comments: Deferred to gyn/next visit. Musculoskeletal:     Right lower leg: No edema.     Left lower leg: No edema.  Lymphadenopathy:     Cervical: No cervical adenopathy.  Skin:    General: Skin is warm.     Findings: No erythema.  Neurological:     General: No focal deficit present.     Mental Status: [name], place, and time.     Gait: Gait normal.  Psychiatric:        Mood and Affect: Mood and affect normal.   ASSESSMENT AND PLAN: Ms.Terrika Yailene Badia was seen here today for Vaginal Odor.   Bacterial vaginosis Discussed differential Dx, provided hx is suggestive for this problem. We decided to treat empirically and re-evaluate if symptoms are persistent. Metronidazole  side effects discussed, she prefers vaginal gel instead oral.  -     metroNIDAZOLE ; Place 1 Applicatorful vaginally at bedtime for 7 days.  Dispense: 70 g; Refill: 0  Hyperlipidemia, unspecified hyperlipidemia type Assessment & Plan: Continue Rosuvastatin  10 mg daily and low fat diet. She is not fasting today, will arrange appt for fasting labs.  Orders: -     Hepatic function panel; Future -     Lipid panel; Future  Elevated transaminase level Assessment & Plan: Mild. Recommend decreasing alcohol intake. Abdominal CT 08/2023: Hepatic steatosis. Will add to next blood work.  Return if symptoms worsen or fail to improve.  I,Emily Lagle,acting as a Neurosurgeon for Cleola Perryman [place], MD.,have documented all relevant documentation on the behalf of Maelynn Moroney [place], MD,as directed by  Virginia Curl [place], MD while in the presence of Haydan Wedig [place], MD.  I, Danarius Mcconathy [place], MD, have reviewed all documentation for this visit. The documentation on 01/06/24 for the exam, diagnosis, procedures, and orders are all accurate and complete. Yanni Quiroa G. [place], MD  [facility]. [place] office.

## 2024-01-06 NOTE — Assessment & Plan Note (Signed)
 Continue Rosuvastatin  10 mg daily and low fat diet. She is not fasting today, will arrange appt for fasting labs.

## 2024-01-07 ENCOUNTER — Other Ambulatory Visit (INDEPENDENT_AMBULATORY_CARE_PROVIDER_SITE_OTHER)

## 2024-01-07 DIAGNOSIS — E785 Hyperlipidemia, unspecified: Secondary | ICD-10-CM

## 2024-01-07 LAB — HEPATIC FUNCTION PANEL
ALT: 48 U/L — ABNORMAL HIGH (ref 0–35)
AST: 130 U/L — ABNORMAL HIGH (ref 0–37)
Albumin: 3.6 g/dL (ref 3.5–5.2)
Alkaline Phosphatase: 122 U/L — ABNORMAL HIGH (ref 39–117)
Bilirubin, Direct: 0.4 mg/dL — ABNORMAL HIGH (ref 0.0–0.3)
Total Bilirubin: 0.7 mg/dL (ref 0.2–1.2)
Total Protein: 6.8 g/dL (ref 6.0–8.3)

## 2024-01-07 LAB — LDL CHOLESTEROL, DIRECT: Direct LDL: 52 mg/dL

## 2024-01-07 LAB — LIPID PANEL
Cholesterol: 157 mg/dL (ref 0–200)
HDL: 49.1 mg/dL (ref >39.00)
NonHDL: 107.43
Total CHOL/HDL Ratio: 3
Triglycerides: 494 mg/dL — ABNORMAL HIGH (ref 0.0–149.0)
VLDL: 98.8 mg/dL — ABNORMAL HIGH (ref 0.0–40.0)

## 2024-01-13 ENCOUNTER — Ambulatory Visit: Payer: Self-pay | Admitting: Family Medicine

## 2024-01-13 DIAGNOSIS — F1020 Alcohol dependence, uncomplicated: Secondary | ICD-10-CM

## 2024-01-13 DIAGNOSIS — R7401 Elevation of levels of liver transaminase levels: Secondary | ICD-10-CM

## 2024-01-21 ENCOUNTER — Ambulatory Visit: Payer: Self-pay | Admitting: *Deleted

## 2024-01-21 ENCOUNTER — Encounter: Payer: Self-pay | Admitting: Internal Medicine

## 2024-01-21 ENCOUNTER — Ambulatory Visit (INDEPENDENT_AMBULATORY_CARE_PROVIDER_SITE_OTHER): Admitting: Internal Medicine

## 2024-01-21 ENCOUNTER — Ambulatory Visit (HOSPITAL_COMMUNITY)
Admission: RE | Admit: 2024-01-21 | Discharge: 2024-01-21 | Disposition: A | Discharge status: Home / Self Care | Source: Ambulatory Visit | Attending: Internal Medicine | Admitting: Internal Medicine

## 2024-01-21 VITALS — BP 130/86 | HR 105 | Temp 99.1°F | Ht 61.25 in | Wt 109.2 lb

## 2024-01-21 DIAGNOSIS — R112 Nausea with vomiting, unspecified: Secondary | ICD-10-CM

## 2024-01-21 DIAGNOSIS — R1031 Right lower quadrant pain: Secondary | ICD-10-CM | POA: Diagnosis present

## 2024-01-21 DIAGNOSIS — R5383 Other fatigue: Secondary | ICD-10-CM | POA: Diagnosis not present

## 2024-01-21 LAB — CBC WITH DIFFERENTIAL/PLATELET
Basophils Absolute: 0.1 K/uL (ref 0.0–0.1)
Basophils Relative: 0.5 % (ref 0.0–3.0)
Eosinophils Absolute: 0 K/uL (ref 0.0–0.7)
Eosinophils Relative: 0.1 % (ref 0.0–5.0)
HCT: 37.7 % (ref 36.0–46.0)
Hemoglobin: 12.5 g/dL (ref 12.0–15.0)
Lymphocytes Relative: 4.9 % — ABNORMAL LOW (ref 12.0–46.0)
Lymphs Abs: 0.5 K/uL — ABNORMAL LOW (ref 0.7–4.0)
MCHC: 33.3 g/dL (ref 30.0–36.0)
MCV: 98.7 fl (ref 78.0–100.0)
Monocytes Absolute: 0.9 K/uL (ref 0.1–1.0)
Monocytes Relative: 8.1 % (ref 3.0–12.0)
Neutro Abs: 9.2 K/uL — ABNORMAL HIGH (ref 1.4–7.7)
Neutrophils Relative %: 86.4 % — ABNORMAL HIGH (ref 43.0–77.0)
Platelets: 164 K/uL (ref 150.0–400.0)
RBC: 3.82 Mil/uL — ABNORMAL LOW (ref 3.87–5.11)
RDW: 13.8 % (ref 11.5–15.5)
WBC: 10.7 K/uL — ABNORMAL HIGH (ref 4.0–10.5)

## 2024-01-21 LAB — URINALYSIS, ROUTINE W REFLEX MICROSCOPIC
Bilirubin Urine: NEGATIVE
Hgb urine dipstick: NEGATIVE
Ketones, ur: 15 — AB
Leukocytes,Ua: NEGATIVE
Nitrite: NEGATIVE
RBC / HPF: NONE SEEN (ref 0–?)
Specific Gravity, Urine: 1.015 (ref 1.000–1.030)
Urine Glucose: NEGATIVE
Urobilinogen, UA: 0.2 (ref 0.0–1.0)
pH: 7.5 (ref 5.0–8.0)

## 2024-01-21 LAB — HEPATIC FUNCTION PANEL
ALT: 42 U/L — ABNORMAL HIGH (ref 0–35)
AST: 110 U/L — ABNORMAL HIGH (ref 0–37)
Albumin: 4 g/dL (ref 3.5–5.2)
Alkaline Phosphatase: 133 U/L — ABNORMAL HIGH (ref 39–117)
Bilirubin, Direct: 0.5 mg/dL — ABNORMAL HIGH (ref 0.0–0.3)
Total Bilirubin: 0.9 mg/dL (ref 0.2–1.2)
Total Protein: 7.4 g/dL (ref 6.0–8.3)

## 2024-01-21 LAB — BASIC METABOLIC PANEL WITH GFR
BUN: 7 mg/dL (ref 6–23)
CO2: 27 meq/L (ref 19–32)
Calcium: 8.7 mg/dL (ref 8.4–10.5)
Chloride: 103 meq/L (ref 96–112)
Creatinine, Ser: 0.57 mg/dL (ref 0.40–1.20)
GFR: 103.53 mL/min (ref >60.00)
Glucose, Bld: 132 mg/dL — ABNORMAL HIGH (ref 70–99)
Potassium: 4 meq/L (ref 3.5–5.1)
Sodium: 141 meq/L (ref 135–145)

## 2024-01-21 LAB — POCT URINE PREGNANCY: Preg Test, Ur: NEGATIVE

## 2024-01-21 LAB — POCT INFLUENZA A/B
Influenza A, POC: NEGATIVE
Influenza B, POC: NEGATIVE

## 2024-01-21 LAB — POC COVID19 BINAXNOW: SARS Coronavirus 2 Ag: NEGATIVE

## 2024-01-21 MED ORDER — IOHEXOL 300 MG/ML  SOLN
100.0000 mL | Freq: Once | INTRAMUSCULAR | Status: AC | PRN
  Administered 2024-01-21: 100 mL via INTRAVENOUS

## 2024-01-21 NOTE — Progress Notes (Signed)
 Chief Complaint  Patient presents with   Abdominal Pain    Pt c/o lower across, sharp pain on Lower R quadrant. Started with dull pain 2-3 days this morning pain worsen, consistent.    Emesis    Pt reports vomitting and nausea.    Back Pain    Pt c/o lower back pain. On and off for about a week.    Headache    And chills. Also fatigue. No cough or sore throat.     HPI: Aroush Chasse 53 y.o. come in for acute sda appt pcp  dr [place]   See above  vegetarian  ate . [place]  no unusual food  Onset  lower back sore  a week ago achy . The lower ab achy lower  . And then worse and awoke last night .  Localizing now to r lower abd pelvic area  Poor po intake this am   cuase of nand v  no upper abd pain  Also get vomiting with migraines  . Not sure .takes suamtriptan if that is issue   ROS: See pertinent positives and negatives per HPI. No uti sx hematuria  . Fever but feels chills  Did have 4-5 drinks last night  workin gon cutting down   Past Medical History:  Diagnosis Date   Allergic rhinitis    Allergy    Asthma    Hx of adenomatous polyp of colon 06/14/2019   Substance abuse (HCC)     Family History  Problem Relation Age of Onset   Hypertension Mother    Colon polyps Mother        pre-cancerous polyps   Alcohol abuse Mother    Hypertension Father    Heart disease Father    Prostate cancer Father    Colon polyps Father        pre-cancerous polyps   Breast cancer Maternal Grandmother    Colon cancer Paternal Grandmother 66   Kidney disease Neg Hx    Gallbladder disease Neg Hx    Esophageal cancer Neg Hx    Stomach cancer Neg Hx    Rectal cancer Neg Hx     Social History   Socioeconomic History   Marital status: Single    Spouse name: Not on file   Number of children: 2   Years of education: Not on file   Highest education level: Doctorate  Occupational History   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[employer]  Tobacco  Use   Smoking status: Former    Current packs/day: 0.00    Types: Cigarettes    Quit date: 05/28/1987    Years since quitting: 36.6   Smokeless tobacco: Never  Vaping Use   Vaping status: Never Used  Substance and Sexual Activity   Alcohol use: Yes    Alcohol/week: 0.0 standard drinks of alcohol    Comment: occ   Drug use: No   Sexual activity: Yes    Birth control/protection: Other-see comments    Comment: VAS  Other Topics Concern   Not on file  Social History Narrative   Divorced, 2 sons   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   Social Drivers of Health   Financial Resource Strain: Low Risk  (05/26/2023)   Overall Financial Resource Strain (CARDIA)    Difficulty of Paying Living Expenses: Not hard at all  Food Insecurity: No Food Insecurity (05/26/2023)   Hunger Vital Sign    Worried  About Running Out of Food in the Last Year: Never true    Ran Out of Food in the Last Year: Never true  Transportation Needs: No Transportation Needs (05/26/2023)   PRAPARE - Administrator, Civil Service (Medical): No    Lack of Transportation (Non-Medical): No  Physical Activity: Sufficiently Active (05/26/2023)   Exercise Vital Sign    Days of Exercise per Week: 5 days    Minutes of Exercise per Session: 30 min  Stress: No Stress Concern Present (05/26/2023)   [employer] of Occupational Health - Occupational Stress Questionnaire    Feeling of Stress : Only a little  Social Connections: Moderately Isolated (05/26/2023)   Social Connection and Isolation Panel    Frequency of Communication with Friends and Family: More than three times a week    Frequency of Social Gatherings with Friends and Family: Twice a week    Attends Religious Services: Never    Database administrator or Organizations: Yes    Attends Engineer, structural: More than 4 times per year    Marital Status: Divorced    Outpatient Medications Prior to Visit  Medication Sig  Dispense Refill   ADVAIR DISKUS 100-50 MCG/DOSE AEPB Inhale 1 puff into the lungs 2 (two) times daily.      AMBULATORY NON FORMULARY MEDICATION Triple Rosacea Cream Azelaic acid15%/Metronidazole  1%/Ivermectin 1% 1 application as needed     Calcium  Carb-Cholecalciferol (CALCIUM  600+D3 PO) Take by mouth.     Cholecalciferol (VITAMIN D  PO) Take 1 tablet by mouth daily.     clobetasol (OLUX) 0.05 % topical foam APPLY TO AFFECTED AREAS OF BODY (NOT FACE) 1 2 TIMES DAILY AS NEEDED     diphenhydrAMINE (BENADRYL) 25 MG tablet Take 25 mg by mouth as needed.     fluticasone (FLONASE) 50 MCG/ACT nasal spray Place into both nostrils daily.     rosuvastatin  (CRESTOR ) 10 MG tablet Take 1 tablet (10 mg total) by mouth daily. 90 tablet 3   silver sulfADIAZINE (SILVADENE) 1 % cream      SUMAtriptan  (IMITREX ) 50 MG tablet TAKE 1 TAB BY MOUTH DAILY AS NEEDED FOR MIGRAINE. MAY REPEAT IN 2 HRS IF HEADACHE PERSISTS OR RECURS 9 tablet 2   tacrolimus (PROTOPIC) 0.1 % ointment      thiamine  100 MG tablet Take 1 tablet (100 mg total) by mouth daily. 90 tablet 3   VENTOLIN  HFA 108 (90 Base) MCG/ACT inhaler Inhale 1-2 puffs into the lungs as needed. Reported on 05/29/2015 18 g 1   triamcinolone cream (KENALOG) 0.1 %  (Patient not taking: Reported on 01/21/2024)     No facility-administered medications prior to visit.     EXAM:  BP 130/86 (BP Location: Left Arm, Patient Position: Sitting, Cuff Size: Normal)   Pulse (!) 105   Temp 99.1 F (37.3 C) (Oral)   Ht 5' 1.25 (1.556 m)   Wt 109 lb 3.2 oz (49.5 kg)   LMP 12/24/2023   SpO2 98%   BMI 20.47 kg/m   Body mass index is 20.47 kg/m.  GENERAL: vitals reviewed and listed above, alert, oriented, appears non toxic   but uncomfortable  HEENT: atraumatic, conjunctiva  clear, no obvious abnormalities on inspection of external nose and ears OP : no lesion edema or exudate  NECK: no obvious masses on inspection palpation  LUNGS: clear to auscultation bilaterally, no  wheezes, rales or rhonchi, good air movement CV: HRRR, no clubbing cyanosis or  peripheral edema nl  cap refill  ABD: no masses  rlq tender and ? Slight rebound? Neg psoas no masses noted  MS: moves all extremities without noticeable focal  abnormality Skin nl cap refill  no acute rash facial erythema  PSYCH: pleasant and cooperative, no obvious depression or anxiety Lab Results  Component Value Date   WBC 5.3 08/18/2023   HGB 11.7 (L) 08/18/2023   HCT 36.3 08/18/2023   PLT 383.0 08/18/2023   GLUCOSE 91 08/18/2023   CHOL 157 01/07/2024   TRIG (H) 01/07/2024    494.0 Triglyceride is over 400; calculations on Lipids are invalid.   HDL 49.10 01/07/2024   LDLDIRECT 52.0 01/07/2024   LDLCALC 304 (H) 08/18/2023   ALT 48 (H) 01/07/2024   AST 130 (H) 01/07/2024   NA 134 (L) 08/18/2023   K 4.0 08/18/2023   CL 101 08/18/2023   CREATININE 0.54 08/18/2023   BUN 6 08/18/2023   CO2 27 08/18/2023   TSH 0.88 01/04/2021   INR 1.0 11/30/2019   HGBA1C 5.1 01/04/2021   BP Readings from Last 3 Encounters:  01/21/24 130/86  01/06/24 128/80  08/18/23 100/70    ASSESSMENT AND PLAN:  Discussed the following assessment and plan:  Pain, abdominal, RLQ - Plan: CBC with Differential/Platelet, Basic metabolic panel with GFR, Hepatic function panel, Urinalysis, Routine w reflex microscopic  Nausea and vomiting, unspecified vomiting type - Plan: CBC with Differential/Platelet, Basic metabolic panel with GFR, Hepatic function panel, Urinalysis, Routine w reflex microscopic, POCT urine pregnancy  Right lower quadrant abdominal pain - Plan: CT ABDOMEN PELVIS W CONTRAST, POCT urine pregnancy New onset sx different than gastritis duodinitis she has had in past   Underlying  alchol intake  Settling to rlq pelvid area   over past 48 hours  . Concern about hydration  Has zofran  at home if needed    Plan stat ct and labs   To ed if worsening   or as indicated .  -Patient advised to return or notify health  care team  if  new concerns arise. Advise etoh abstinence ( no hx of wd dts etc )  Patient Instructions  Lab today  no etoh Planning state  ct abd pemen pelvis to check for appendicitis To ed if  worse or as indicated depending .      Hillary Struss K. Natale Thoma M.D.

## 2024-01-21 NOTE — Telephone Encounter (Signed)
 FYI Only or Action Required?: FYI only for provider.  Patient was last seen in primary care on 01/06/2024 by Shelly Edwards, Shelly G, MD.  Called Nurse Triage reporting Abdominal Pain (Nausea, vomiting, lower back pain).  Symptoms began yesterday.  Interventions attempted: Rest, hydration, or home remedies.  Symptoms are: gradually worsening.  Triage Disposition: See HCP Within 4 Hours (Or PCP Triage)  Patient/caregiver understands and will follow disposition?: Yes    Reason for Disposition  [1] MILD-MODERATE pain AND [2] constant AND [3] present > 2 hours  Answer Assessment - Initial Assessment Questions 1. LOCATION: Where does it hurt?      Started in lower back- radiated to front 2. RADIATION: Does the pain shoot anywhere else? (e.Edwards., chest, back)     Throbbing on R side 3. ONSET: When did the pain begin? (e.Edwards., minutes, hours or days ago)      Extreme last 12 hours, stares yesterday 4. SUDDEN: Gradual or sudden onset?     gradual 5. PATTERN Does the pain come and go, or is it constant?     Back pain- constant, abdominal pain- intermittent - but more steady 6. SEVERITY: How bad is the pain?  (e.Edwards., Scale 1-10; mild, moderate, or severe)     5-6/10 7. RECURRENT SYMPTOM: Have you ever had this type of stomach pain before? If Yes, ask: When was the last time? and What happened that time?      No 8. CAUSE: What do you think is causing the stomach pain? (e.Edwards., gallstones, recent abdominal surgery)     unsure 9. RELIEVING/AGGRAVATING FACTORS: What makes it better or worse? (e.Edwards., antacids, bending or twisting motion, bowel movement)     no 10. OTHER SYMPTOMS: Do you have any other symptoms? (e.Edwards., back pain, diarrhea, fever, urination pain, vomiting)       Chills, nausea, headache,vomiting  Protocols used: Abdominal Pain - [facility]   Copied from CRM #[id number]. Topic: Clinical - Red Word Triage >> Jan 21, 2024  8:24 AM Thersia BROCKS wrote: Red Word that  prompted transfer to Nurse Triage: Patient called in regarding some symptoms, lower back pain and lower stomach pain, Pain on the right side is throbbing, having some nausea vomting chills and headache

## 2024-01-21 NOTE — Addendum Note (Signed)
 Addended by: Yvonne Stopher on: 01/21/2024 11:56 AM   Modules accepted: Orders

## 2024-01-21 NOTE — Patient Instructions (Signed)
 Lab today  no etoh Planning state  ct abd pemen pelvis to check for appendicitis To ed if  worse or as indicated depending .

## 2024-01-21 NOTE — Addendum Note (Signed)
 Addended by: Senya Hinzman on: 01/21/2024 11:44 AM   Modules accepted: Orders

## 2024-01-24 ENCOUNTER — Ambulatory Visit: Payer: Self-pay | Admitting: Internal Medicine

## 2024-01-24 DIAGNOSIS — R3989 Other symptoms and signs involving the genitourinary system: Secondary | ICD-10-CM

## 2024-01-24 NOTE — Progress Notes (Signed)
 Normal ct  except for fatty liver  that I assume is stable  not sure if she saw results since I was ouit of office .  Suspect she may have a uti  but dont see  any communications  will review labs

## 2024-01-24 NOTE — Progress Notes (Signed)
 Lab consistent with infection or inflammation  Urine could be infected . Liver tests about the same  Please order a  urine culture  and if still having  symptoms  and not allergic send in  Septra  ds 1 po bid for 5  days disp 10

## 2024-01-25 ENCOUNTER — Other Ambulatory Visit

## 2024-01-25 DIAGNOSIS — R3989 Other symptoms and signs involving the genitourinary system: Secondary | ICD-10-CM

## 2024-01-25 MED ORDER — SULFAMETHOXAZOLE-TRIMETHOPRIM 800-160 MG PO TABS: 1.0000 | ORAL_TABLET | Freq: Two times a day (BID) | ORAL | 0 refills | Status: AC

## 2024-01-27 ENCOUNTER — Ambulatory Visit: Payer: Self-pay | Admitting: Internal Medicine

## 2024-01-27 LAB — URINE CULTURE
MICRO NUMBER:: 16845715
SPECIMEN QUALITY:: ADEQUATE

## 2024-01-27 MED ORDER — AMOXICILLIN 875 MG PO TABS: 875.0000 mg | ORAL_TABLET | Freq: Two times a day (BID) | ORAL | 0 refills | Status: AC

## 2024-01-27 NOTE — Progress Notes (Signed)
 Urine culture shows a bacteria that  is usually treated with penicillin  derivatives .   The antibiotic septra   may not adequately treat this infection So change medicine and send in amoxicillin  875 1 twice a day for 5 days disp 10 .

## 2024-02-17 NOTE — Telephone Encounter (Signed)
 Set pt an appointment with [facility] tomorrow at 200pm

## 2024-02-18 ENCOUNTER — Ambulatory Visit (INDEPENDENT_AMBULATORY_CARE_PROVIDER_SITE_OTHER): Admitting: Adult Health

## 2024-02-18 ENCOUNTER — Encounter: Payer: Self-pay | Admitting: Adult Health

## 2024-02-18 VITALS — BP 100/70 | HR 81 | Temp 98.0°F | Ht 61.25 in | Wt 117.0 lb

## 2024-02-18 DIAGNOSIS — R350 Frequency of micturition: Secondary | ICD-10-CM | POA: Diagnosis not present

## 2024-02-18 DIAGNOSIS — R300 Dysuria: Secondary | ICD-10-CM | POA: Diagnosis not present

## 2024-02-18 LAB — POCT URINALYSIS DIPSTICK
Bilirubin, UA: NEGATIVE
Blood, UA: NEGATIVE
Glucose, UA: NEGATIVE
Ketones, UA: NEGATIVE
Nitrite, UA: NEGATIVE
Protein, UA: NEGATIVE
Spec Grav, UA: <=1.005 — AB (ref 1.010–1.025)
Urobilinogen, UA: 0.2 U/dL
pH, UA: 7 (ref 5.0–8.0)

## 2024-02-18 MED ORDER — PHENAZOPYRIDINE HCL 100 MG PO TABS: 100.0000 mg | ORAL_TABLET | Freq: Three times a day (TID) | ORAL | 0 refills | Status: AC | PRN

## 2024-02-18 NOTE — Progress Notes (Signed)
 Subjective:    Patient ID: Shelly Edwards, female    DOB: 01-04-1971, 53 y.o.   MRN: [id number]  HPI  Discussed the use of AI scribe software for clinical note transcription with the patient, who gave verbal consent to proceed.  History of Present Illness   Shelly Edwards is a 53 year old female who presents with symptoms suggestive of a urinary tract infection.  She was originally seen on 01/21/2024 at which time she had been experiencing lower back pain and lower abdominal aching  Initially, she did not have typical UTI symptoms. She had a CT scan done which did not show cause of her symptoms, urine culture showed Streptococcus agalactiae. She was prescribed a five day course of Augmentin , but discomfort, frequency, urgency persist, and she has mild lower pelvic pressure intermittently. The discomfort is not burning but is described as 'little painful' and increased notably yesterday, rated as 'five plus' on a scale of one to ten. There is no blood in urine, fevers, or chills.  Lower back pain varies, with significant pain on some days, requiring a heating pad and seat warmers for relief. Pain sometimes radiates towards her abdomen when twisting. Today, the pain is minimal.  She denies caffeine and smoking use. Her fluid intake includes water mixed with liquid IV and occasional Diet Coke, with a recent reduction in Diet Coke consumption.       Review of Systems See HPI   Past Medical History:  Diagnosis Date   Allergic rhinitis    Allergy    Asthma    Hx of adenomatous polyp of colon 06/14/2019   Substance abuse (HCC)     Social History   Socioeconomic History   Marital status: Single    Spouse name: Not on file   Number of children: 2   Years of education: Not on file   Highest education level: Doctorate  Occupational History   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[employer]  Tobacco Use   Smoking status: Former    Current  packs/day: 0.00    Types: Cigarettes    Quit date: 05/28/1987    Years since quitting: 36.7   Smokeless tobacco: Never  Vaping Use   Vaping status: Never Used  Substance and Sexual Activity   Alcohol use: Yes    Alcohol/week: 0.0 standard drinks of alcohol    Comment: occ   Drug use: No   Sexual activity: Yes    Birth control/protection: Other-see comments    Comment: VAS  Other Topics Concern   Not on file  Social History Narrative   Divorced, 2 sons   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   Social Drivers of Corporate investment banker Strain: Low Risk  (05/26/2023)   Overall Financial Resource Strain (CARDIA)    Difficulty of Paying Living Expenses: Not hard at all  Food Insecurity: No Food Insecurity (05/26/2023)   Hunger Vital Sign    Worried About Running Out of Food in the Last Year: Never true    Ran Out of Food in the Last Year: Never true  Transportation Needs: No Transportation Needs (05/26/2023)   PRAPARE - Administrator, Civil Service (Medical): No    Lack of Transportation (Non-Medical): No  Physical Activity: Sufficiently Active (05/26/2023)   Exercise Vital Sign    Days of Exercise per Week: 5 days    Minutes of Exercise per Session: 30 min  Stress: No Stress Concern Present (05/26/2023)   [employer] of Occupational Health - Occupational Stress Questionnaire    Feeling of Stress : Only a little  Social Connections: Moderately Isolated (05/26/2023)   Social Connection and Isolation Panel    Frequency of Communication with Friends and Family: More than three times a week    Frequency of Social Gatherings with Friends and Family: Twice a week    Attends Religious Services: Never    Database administrator or Organizations: Yes    Attends Engineer, structural: More than 4 times per year    Marital Status: Divorced  Catering manager Violence: Not on file    Past Surgical History:  Procedure Laterality Date    AUGMENTATION MAMMAPLASTY     COLONOSCOPY  2016   UMBILICAL HERNIA REPAIR  2008   VAGINAL WOUND CLOSURE / REPAIR  4/10   RIGHT    Family History  Problem Relation Age of Onset   Hypertension Mother    Colon polyps Mother        pre-cancerous polyps   Alcohol abuse Mother    Hypertension Father    Heart disease Father    Prostate cancer Father    Colon polyps Father        pre-cancerous polyps   Breast cancer Maternal Grandmother    Colon cancer Paternal Grandmother 37   Kidney disease Neg Hx    Gallbladder disease Neg Hx    Esophageal cancer Neg Hx    Stomach cancer Neg Hx    Rectal cancer Neg Hx     Allergies  Allergen Reactions   Molds & Smuts Itching and Shortness Of Breath   Cat Dander Other (See Comments)   Dust Mite Extract Other (See Comments)   Gramineae Pollens Other (See Comments)    Current Outpatient Medications on File Prior to Visit  Medication Sig Dispense Refill   ADVAIR DISKUS 100-50 MCG/DOSE AEPB Inhale 1 puff into the lungs 2 (two) times daily.      AMBULATORY NON FORMULARY MEDICATION Triple Rosacea Cream Azelaic acid15%/Metronidazole  1%/Ivermectin 1% 1 application as needed     Calcium  Carb-Cholecalciferol (CALCIUM  600+D3 PO) Take by mouth.     Cholecalciferol (VITAMIN D  PO) Take 1 tablet by mouth daily.     clobetasol (OLUX) 0.05 % topical foam APPLY TO AFFECTED AREAS OF BODY (NOT FACE) 1 2 TIMES DAILY AS NEEDED     diphenhydrAMINE (BENADRYL) 25 MG tablet Take 25 mg by mouth as needed.     fluticasone (FLONASE) 50 MCG/ACT nasal spray Place into both nostrils daily.     rosuvastatin  (CRESTOR ) 10 MG tablet Take 1 tablet (10 mg total) by mouth daily. 90 tablet 3   silver sulfADIAZINE (SILVADENE) 1 % cream      sulfamethoxazole -trimethoprim  (BACTRIM  DS) 800-160 MG tablet Take 1 tablet by mouth 2 (two) times daily. 10 tablet 0   SUMAtriptan  (IMITREX ) 50 MG tablet TAKE 1 TAB BY MOUTH DAILY AS NEEDED FOR MIGRAINE. MAY REPEAT IN 2 HRS IF HEADACHE PERSISTS  OR RECURS 9 tablet 2   tacrolimus (PROTOPIC) 0.1 % ointment      thiamine  100 MG tablet Take 1 tablet (100 mg total) by mouth daily. 90 tablet 3   triamcinolone cream (KENALOG) 0.1 %  (Patient not taking: Reported on 01/21/2024)     VENTOLIN  HFA 108 (90 Base) MCG/ACT inhaler Inhale 1-2 puffs into the lungs as needed. Reported on 05/29/2015 18 g 1   No current facility-administered medications  on file prior to visit.    BP 100/70   Pulse 81   Temp 98 F (36.7 C) (Oral)   Ht 5' 1.25 (1.556 m)   Wt 117 lb (53.1 kg)   LMP 12/27/2023   SpO2 99%   BMI 21.93 kg/m       Objective:   Physical Exam Vitals and nursing note reviewed.  Constitutional:      Appearance: Normal appearance.  Cardiovascular:     Rate and Rhythm: Regular rhythm.     Pulses: Normal pulses.     Heart sounds: Normal heart sounds.  Pulmonary:     Effort: Pulmonary effort is normal.     Breath sounds: Normal breath sounds.  Abdominal:     General: Abdomen is flat. Bowel sounds are normal.     Palpations: Abdomen is soft.     Tenderness: There is abdominal tenderness in the suprapubic area. There is no right CVA tenderness or left CVA tenderness.  Musculoskeletal:        General: Normal range of motion.  Skin:    General: Skin is warm and dry.  Neurological:     General: No focal deficit present.     Mental Status: [name], place, and time.  Psychiatric:        Mood and Affect: Mood normal.        Behavior: Behavior normal.        Thought Content: Thought content normal.        Judgment: Judgment normal.        Assessment & Plan:  1. Urinary frequency (Primary)  - POC Urinalysis Dipstick + leuks.  - Will send culture to confirm UTI. Possibly OAB and can consider ditropan.  - Culture, Urine; Future - phenazopyridine  (PYRIDIUM ) 100 MG tablet; Take 1 tablet (100 mg total) by mouth 3 (three) times daily as needed for pain.  Dispense: 10 tablet; Refill: 0  2. Dysuria  -  phenazopyridine  (PYRIDIUM ) 100 MG tablet; Take 1 tablet (100 mg total) by mouth 3 (three) times daily as needed for pain.  Dispense: 10 tablet; Refill: 0  Darleene Shape, NP

## 2024-02-20 LAB — URINE CULTURE
MICRO NUMBER:: 16955391
SPECIMEN QUALITY:: ADEQUATE

## 2024-02-23 ENCOUNTER — Ambulatory Visit: Payer: Self-pay | Admitting: Adult Health

## 2024-04-21 ENCOUNTER — Other Ambulatory Visit: Payer: Self-pay | Admitting: Physician Assistant

## 2024-04-26 ENCOUNTER — Ambulatory Visit: Payer: Self-pay

## 2024-04-26 NOTE — Telephone Encounter (Signed)
 FYI Only or Action Required?: FYI only for provider: appointment scheduled on 05/02/24.  Patient was last seen in primary care on 02/18/2024 by Merna Huxley, NP.  Called Nurse Triage reporting Abdominal Pain and Back Pain.  Symptoms began several months ago.  Interventions attempted: OTC medications: Advil, Rest, hydration, or home remedies, and Ice/heat application.  Symptoms are: stable.  Triage Disposition: See PCP When Office is Open (Within 3 Days)  Patient/caregiver understands and will follow disposition?: Yes Reason for Disposition  Back pain present > 2 weeks  Answer Assessment - Initial Assessment Questions Unsure if they are unrelated - states has been to UC and ED for back and stomach issues and cannot seem to find the cause. Has tried stretching, heating pad. Patient stated she also needs follow up bloodwork  1. ONSET: When did the pain begin? (e.g., minutes, hours, days)     On and off for a long time, but constantly for weeks  2. LOCATION: Where does it hurt? (upper, mid or lower back)     Lower back, right side  3. SEVERITY: How bad is the pain?  (e.g., Scale 1-10; mild, moderate, or severe)     6/10  4. PATTERN: Is the pain constant? (e.g., yes, no; constant, intermittent)      Constant  5. RADIATION: Does the pain shoot into your legs or somewhere else?     Abdomen  6. CAUSE:  What do you think is causing the back pain?      Unsure  7. BACK OVERUSE:  Any recent lifting of heavy objects, strenuous work or exercise?     Denies  8. MEDICINES: What have you taken so far for the pain? (e.g., nothing, acetaminophen , NSAIDS)     Advil  9. NEUROLOGIC SYMPTOMS: Do you have any weakness, numbness, or problems with bowel/bladder control?     Denies  10. OTHER SYMPTOMS: Do you have any other symptoms? (e.g., fever, abdomen pain, burning with urination, blood in urine)       Stomach pain  Protocols used: Back Pain-[facility]  Copied from CRM  #[id number]. Topic: Clinical - Red Word Triage >> Apr 26, 2024  2:27 PM Shelly Edwards wrote: Reason for RMF:dunfjry and back pain

## 2024-05-02 ENCOUNTER — Encounter: Payer: Self-pay | Admitting: Family Medicine

## 2024-05-02 ENCOUNTER — Ambulatory Visit (INDEPENDENT_AMBULATORY_CARE_PROVIDER_SITE_OTHER): Admitting: Family Medicine

## 2024-05-02 ENCOUNTER — Ambulatory Visit: Payer: Self-pay | Admitting: Family Medicine

## 2024-05-02 ENCOUNTER — Ambulatory Visit (INDEPENDENT_AMBULATORY_CARE_PROVIDER_SITE_OTHER)

## 2024-05-02 VITALS — BP 110/82 | HR 81 | Temp 98.1°F | Resp 16 | Ht 61.25 in | Wt 107.2 lb

## 2024-05-02 DIAGNOSIS — M545 Low back pain, unspecified: Secondary | ICD-10-CM

## 2024-05-02 DIAGNOSIS — E781 Pure hyperglyceridemia: Secondary | ICD-10-CM

## 2024-05-02 DIAGNOSIS — R7989 Other specified abnormal findings of blood chemistry: Secondary | ICD-10-CM

## 2024-05-02 DIAGNOSIS — D492 Neoplasm of unspecified behavior of bone, soft tissue, and skin: Secondary | ICD-10-CM | POA: Diagnosis not present

## 2024-05-02 MED ORDER — PREDNISONE 20 MG PO TABS: ORAL_TABLET | ORAL | 0 refills | Status: AC

## 2024-05-02 NOTE — Assessment & Plan Note (Signed)
 Currently she is on rosuvastatin  10 mg daily and low-fat diet. She is going to arrange appointment for fasting labs and recommendation will be given according to results.

## 2024-05-02 NOTE — Patient Instructions (Addendum)
 A few things to remember from today's visit:  Right low back pain, unspecified chronicity, unspecified whether sciatica present - Plan: DG Lumbar Spine Complete ? Radicular pain. Take Prednisone  with breakfast. Monitor for new symptoms, including rash. If not better the next step will be back MRI.  If you need refills for medications you take chronically, please call your pharmacy. Do not use My Chart to request refills or for acute issues that need immediate attention. If you send a my chart message, it may take a few days to be addressed, specially if I am not in the office.  Please be sure medication list is accurate. If a new problem present, please set up appointment sooner than planned today.

## 2024-05-02 NOTE — Progress Notes (Signed)
 ACUTE VISIT Chief Complaint  Patient presents with   Back Pain    Pt c/o back pain R side radiates to R side groin area. Going on for 3-5 wks.    Discussed the use of AI scribe software for clinical note transcription with the patient, who gave verbal consent to proceed.  History of Present Illness Shelly Edwards is a 53 year old female a PMHx significant for asthma, IBS, B12 deficiency, HLD, and vitamin D  deficiency  who is here today complaining of back pain as described above.  She has been experiencing persistent right-sided low back pain for the past few weeks, which radiates to right side of abdomen and down to the groin area. The pain is described as achy and stiff, with a constant intensity of 3 to 4 out of 10. It is not sharp or shooting. She uses a heating pad and stretches to alleviate the discomfort temporarily.  The pain sometimes worsens when sitting and working all day, it is less noticeable when lying down.  Evaluated for epigastric abdominal pain in 08/2023, for which she had an abdominal/pelvic CT In 01/2024 she was seen here in the clinic for right-sided abdominal pain,right low back pain,and nausea.  She had abdominal/pelvic CT repeated on 01/21/24.  At this time there is no associated nausea, vomiting, changes in bowel habits, numbness, or tingling.  No history of kidney stones. She mentions experiencing urgency and frequency of urination in 02/2024, she was seen and treated for UTI; but these symptoms have resolved.  She has not seen a gynecologist recently, and her last menstrual period was a couple of months ago, hx of irregular menses.   She also mentions having small knot on her lower back and wonders if this could be causing her back pain. She noted lesions long time ago, bilateral, L>R. She has not noted skin changes.  History of abnormal liver test (elevated transaminases and alkaline phosphatase). She is not interested in having blood work  today, she would like to arrange lab appointment for fasting labs. She has decreased alcohol intake, replaced it for THC infused drinks, which has helped. She has done counseling  and taken medications in the past, did not feel like they help with decreasing alcohol intake.  Lab Results  Component Value Date   ALT 42 (H) 01/21/2024   AST 110 (H) 01/21/2024   ALKPHOS 133 (H) 01/21/2024   BILITOT 0.9 01/21/2024   Hyperlipidemia: Currently she is on rosuvastatin  10 mg daily.  Lab Results  Component Value Date   CHOL 157 01/07/2024   HDL 49.10 01/07/2024   LDLCALC 304 (H) 08/18/2023   LDLDIRECT 52.0 01/07/2024   TRIG (H) 01/07/2024    494.0 Triglyceride is over 400; calculations on Lipids are invalid.   CHOLHDL 3 01/07/2024   Review of Systems  Constitutional:  Negative for activity change, appetite change, chills and fever.  Respiratory:  Negative for cough and shortness of breath.   Cardiovascular:  Negative for chest pain and leg swelling.  Gastrointestinal:  Negative for abdominal distention and blood in stool.  Genitourinary:  Negative for dysuria, hematuria and urgency.  Musculoskeletal:  Positive for back pain.  Skin:  Negative for rash.  Neurological:  Negative for syncope and weakness.  See other pertinent positives and negatives in HPI.  Current Outpatient Medications on File Prior to Visit  Medication Sig Dispense Refill   ADVAIR DISKUS 100-50 MCG/DOSE AEPB Inhale 1 puff into the lungs 2 (two) times daily.  Calcium  Carb-Cholecalciferol (CALCIUM  600+D3 PO) Take by mouth.     Cholecalciferol (VITAMIN D  PO) Take 1 tablet by mouth daily.     rosuvastatin  (CRESTOR ) 10 MG tablet Take 1 tablet (10 mg total) by mouth daily. 90 tablet 3   silver sulfADIAZINE (SILVADENE) 1 % cream      SUMAtriptan  (IMITREX ) 50 MG tablet TAKE 1 TAB BY MOUTH DAILY AS NEEDED FOR MIGRAINE. MAY REPEAT IN 2 HRS IF HEADACHE PERSISTS OR RECURS 9 tablet 2   tacrolimus (PROTOPIC) 0.1 % ointment       thiamine  100 MG tablet Take 1 tablet (100 mg total) by mouth daily. 90 tablet 3   VENTOLIN  HFA 108 (90 Base) MCG/ACT inhaler Inhale 1-2 puffs into the lungs as needed. Reported on 05/29/2015 18 g 1   AMBULATORY NON FORMULARY MEDICATION Triple Rosacea Cream Azelaic acid15%/Metronidazole  1%/Ivermectin 1% 1 application as needed     clobetasol (OLUX) 0.05 % topical foam APPLY TO AFFECTED AREAS OF BODY (NOT FACE) 1 2 TIMES DAILY AS NEEDED (Patient not taking: Reported on 05/02/2024)     diphenhydrAMINE (BENADRYL) 25 MG tablet Take 25 mg by mouth as needed. (Patient not taking: Reported on 05/02/2024)     fluticasone (FLONASE) 50 MCG/ACT nasal spray Place into both nostrils daily. (Patient not taking: Reported on 05/02/2024)     phenazopyridine  (PYRIDIUM ) 100 MG tablet Take 1 tablet (100 mg total) by mouth 3 (three) times daily as needed for pain. (Patient not taking: Reported on 05/02/2024) 10 tablet 0   sulfamethoxazole -trimethoprim  (BACTRIM  DS) 800-160 MG tablet Take 1 tablet by mouth 2 (two) times daily. 10 tablet 0   triamcinolone cream (KENALOG) 0.1 %  (Patient not taking: Reported on 01/21/2024)     No current facility-administered medications on file prior to visit.    Past Medical History:  Diagnosis Date   Allergic rhinitis    Allergy    Asthma    Hx of adenomatous polyp of colon 06/14/2019   Substance abuse (HCC)    Allergies  Allergen Reactions   Molds & Smuts Itching and Shortness Of Breath   Cat Dander Other (See Comments)   Dust Mite Extract Other (See Comments)   Gramineae Pollens Other (See Comments)    Social History   Socioeconomic History   Marital status: Single    Spouse name: Not on file   Number of children: 2   Years of education: Not on file   Highest education level: Doctorate  Occupational History   Occupation: Medical Sales Representative: [employer]  Tobacco Use   Smoking status: Former    Current packs/day: 0.00    Types: Cigarettes     Quit date: 05/28/1987    Years since quitting: 36.9   Smokeless tobacco: Never  Vaping Use   Vaping status: Never Used  Substance and Sexual Activity   Alcohol use: Yes    Alcohol/week: 0.0 standard drinks of alcohol    Comment: occ   Drug use: No   Sexual activity: Yes    Birth control/protection: Other-see comments    Comment: VAS  Other Topics Concern   Not on file  Social History Narrative   Divorced, 2 sons   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   Social Drivers of Corporate Investment Banker Strain: Low Risk  (05/26/2023)   Overall Financial Resource Strain (CARDIA)    Difficulty of Paying Living Expenses: Not hard at all  Food Insecurity: No Food  Insecurity (05/26/2023)   Hunger Vital Sign    Worried About Running Out of Food in the Last Year: Never true    Ran Out of Food in the Last Year: Never true  Transportation Needs: No Transportation Needs (05/26/2023)   PRAPARE - Administrator, Civil Service (Medical): No    Lack of Transportation (Non-Medical): No  Physical Activity: Sufficiently Active (05/26/2023)   Exercise Vital Sign    Days of Exercise per Week: 5 days    Minutes of Exercise per Session: 30 min  Stress: No Stress Concern Present (05/26/2023)   [employer] of Occupational Health - Occupational Stress Questionnaire    Feeling of Stress : Only a little  Social Connections: Moderately Isolated (05/26/2023)   Social Connection and Isolation Panel    Frequency of Communication with Friends and Family: More than three times a week    Frequency of Social Gatherings with Friends and Family: Twice a week    Attends Religious Services: Never    Database Administrator or Organizations: Yes    Attends Engineer, Structural: More than 4 times per year    Marital Status: Divorced    Vitals:   05/02/24 1308  BP: 110/82  Pulse: 81  Resp: 16  Temp: 98.1 F (36.7 C)  SpO2: 98%   Body mass index is 20.09  kg/m.  Physical Exam Vitals and nursing note reviewed.  Constitutional:      General: She is not in acute distress.    Appearance: She is well-developed. She is not ill-appearing.  HENT:     Head: Normocephalic and atraumatic.  Eyes:     Conjunctiva/sclera: Conjunctivae normal.  Cardiovascular:     Rate and Rhythm: Normal rate and regular rhythm.     Heart sounds: No murmur heard. Pulmonary:     Effort: Pulmonary effort is normal. No respiratory distress.     Breath sounds: Normal breath sounds.  Abdominal:     Palpations: Abdomen is soft. There is no hepatomegaly or mass.     Tenderness: There is no abdominal tenderness.  Musculoskeletal:     Lumbar back: No tenderness or bony tenderness. Positive left straight leg raise test. Negative right straight leg raise test.     Comments: Pain elicited with right hip flexion and external rotation.   Skin:    General: Skin is warm.     Findings: No erythema or rash.  Neurological:     General: No focal deficit present.     Mental Status: [name], place, and time.     Gait: Gait normal.  Psychiatric:        Mood and Affect: Mood and affect normal.    ASSESSMENT AND PLAN:  Shelly Edwards was seen today for lower back pain and right -sided abdominal pain. Orders Placed This Encounter  Procedures   DG Lumbar Spine Complete   Hepatic function panel   Protime-INR   Lipid panel   Right low back pain, unspecified chronicity, unspecified whether sciatica present We discussed possible etiologies. Back pain and abdominal/inguinal pain could be certainly be 2 different conditions but pain of abdomen and groin seems to generate fro right low back.  She has had abdominal and/pelvic CT done in 08/27/2023 and 01/27/2024, negative for cholelithiasis, kidney stones, or gynecologic abnormalities. ?  Radicular pain. She agrees with trial of prednisone  taper, we discussed some side effects, instructed to take it with  breakfast. Monitor for new  symptoms, including rash. If pain is persistent, we could consider a lumbar/lower thoracic MRI and/or evaluation by sports medicine. In regard to pain management , it is mild, can take Acetaminophen  up to 2000 mg daily, no NSAID's. Further recommendation will be given according to lumbar x-ray result. Instructed about warning signs.  -     DG Lumbar Spine Complete; Future -     predniSONE ; 2 tabs for 5 days, 1 tabs for 4 days, and 1/2 tab for 3 days. Take tables together with breakfast.  Dispense: 16 tablet; Refill: 0  Pure hypertriglyceridemia Assessment & Plan: Currently she is on rosuvastatin  10 mg daily and low-fat diet. She is going to arrange appointment for fasting labs and recommendation will be given according to results.  Orders: -     Lipid panel; Future  Abnormal liver function test Otherwise stable. AST>ALT (01/21/24 it was 110-42). Stressed the importance of decreasing alcohol intake. She is going to arrange lab appt.  -     Hepatic function panel; Future -     Protime-INR; Future  Neoplasm of soft tissue of back History and examination do not suggest a serious process.?  Lipoma. For now recommend continue monitoring for changes.  At some point we could arrange appointment with general surgeon to discussed removal.  Return if symptoms worsen or fail to improve, for keep next appointment.  Shelly Edwards G. Millee Denise, MD  [facility]. [place] office.

## 2024-05-04 ENCOUNTER — Other Ambulatory Visit (INDEPENDENT_AMBULATORY_CARE_PROVIDER_SITE_OTHER)

## 2024-05-04 DIAGNOSIS — E781 Pure hyperglyceridemia: Secondary | ICD-10-CM

## 2024-05-04 DIAGNOSIS — R7989 Other specified abnormal findings of blood chemistry: Secondary | ICD-10-CM | POA: Diagnosis not present

## 2024-05-04 LAB — LIPID PANEL
Cholesterol: 210 mg/dL — ABNORMAL HIGH (ref 0–200)
HDL: 132.6 mg/dL (ref >39.00)
LDL Cholesterol: 60 mg/dL (ref 0–99)
NonHDL: 77.8
Total CHOL/HDL Ratio: 2
Triglycerides: 89 mg/dL (ref 0.0–149.0)
VLDL: 17.8 mg/dL (ref 0.0–40.0)

## 2024-05-04 LAB — PROTIME-INR
INR: 1 ratio (ref 0.8–1.0)
Prothrombin Time: 10.6 s (ref 9.6–13.1)

## 2024-05-04 LAB — HEPATIC FUNCTION PANEL
ALT: 17 U/L (ref 0–35)
AST: 40 U/L — ABNORMAL HIGH (ref 0–37)
Albumin: 4.1 g/dL (ref 3.5–5.2)
Alkaline Phosphatase: 79 U/L (ref 39–117)
Bilirubin, Direct: 0.2 mg/dL (ref 0.0–0.3)
Total Bilirubin: 0.5 mg/dL (ref 0.2–1.2)
Total Protein: 6.7 g/dL (ref 6.0–8.3)

## 2024-05-20 ENCOUNTER — Telehealth: Payer: Self-pay | Admitting: Physician Assistant

## 2024-05-20 MED ORDER — ONDANSETRON 8 MG PO TBDP: 8.0000 mg | ORAL_TABLET | Freq: Three times a day (TID) | ORAL | 9 refills | Status: AC | PRN

## 2024-05-20 NOTE — Telephone Encounter (Signed)
 PT is asking to have a new prescription for Zofran  sent to [facility].She has scheduled a future appointment to continue medication

## 2024-05-20 NOTE — Telephone Encounter (Signed)
 Zofran  sent to pharmacy. Spoke with patient and she is due for follow up in 01/2025. Patient aware. Appointment canceled.

## 2024-07-01 ENCOUNTER — Ambulatory Visit: Admitting: Gastroenterology
